# Patient Record
Sex: Male | Born: 1955 | Race: White | Hispanic: No | Marital: Married | Smoking: Current every day smoker
Health system: Southern US, Community
[De-identification: ages and names within clinical notes are randomized; demographics above are authoritative.]

## PROBLEM LIST (undated history)

## (undated) DIAGNOSIS — Z91018 Allergy to other foods: Secondary | ICD-10-CM

## (undated) DIAGNOSIS — R350 Frequency of micturition: Secondary | ICD-10-CM

## (undated) DIAGNOSIS — T782XXA Anaphylactic shock, unspecified, initial encounter: Secondary | ICD-10-CM

## (undated) DIAGNOSIS — Z86010 Personal history of colonic polyps: Principal | ICD-10-CM

## (undated) DIAGNOSIS — Z7689 Persons encountering health services in other specified circumstances: Secondary | ICD-10-CM

## (undated) DIAGNOSIS — K922 Gastrointestinal hemorrhage, unspecified: Secondary | ICD-10-CM

## (undated) DIAGNOSIS — A048 Other specified bacterial intestinal infections: Secondary | ICD-10-CM

## (undated) HISTORY — DX: Allergy to other foods: Z91.018

## (undated) HISTORY — PX: PROSTATE SURGERY: SHX751

## (undated) HISTORY — PX: ANKLE FRACTURE SURGERY: SHX122

## (undated) HISTORY — PX: RHINOPLASTY: SUR1284

## (undated) HISTORY — DX: Other specified bacterial intestinal infections: A04.8

## (undated) HISTORY — DX: Frequency of micturition: R35.0

## (undated) HISTORY — DX: Gastrointestinal hemorrhage, unspecified: K92.2

## (undated) HISTORY — DX: Personal history of colonic polyps: Z86.010

## (undated) HISTORY — DX: Persons encountering health services in other specified circumstances: Z76.89

---

## 2003-11-30 ENCOUNTER — Emergency Department: Payer: Self-pay | Admitting: Emergency Medicine

## 2005-08-16 ENCOUNTER — Inpatient Hospital Stay: Payer: Self-pay | Admitting: Specialist

## 2008-07-30 ENCOUNTER — Emergency Department: Payer: Self-pay | Admitting: Emergency Medicine

## 2010-05-26 ENCOUNTER — Emergency Department (HOSPITAL_COMMUNITY)
Admission: EM | Admit: 2010-05-26 | Discharge: 2010-05-27 | Disposition: A | Discharge status: Home / Self Care | Payer: Self-pay | Attending: Emergency Medicine | Admitting: Emergency Medicine

## 2010-05-26 DIAGNOSIS — X58XXXA Exposure to other specified factors, initial encounter: Secondary | ICD-10-CM | POA: Insufficient documentation

## 2010-05-26 DIAGNOSIS — T782XXA Anaphylactic shock, unspecified, initial encounter: Secondary | ICD-10-CM | POA: Insufficient documentation

## 2010-05-26 LAB — DIFFERENTIAL
Basophils Absolute: 0 10*3/uL (ref 0.0–0.1)
Lymphocytes Relative: 9 % — ABNORMAL LOW (ref 12–46)
Lymphs Abs: 1.4 10*3/uL (ref 0.7–4.0)
Neutrophils Relative %: 85 % — ABNORMAL HIGH (ref 43–77)

## 2010-05-26 LAB — CBC
HCT: 45.9 % (ref 39.0–52.0)
MCV: 97.2 fL (ref 78.0–100.0)
Platelets: 330 10*3/uL (ref 150–400)
RBC: 4.72 MIL/uL (ref 4.22–5.81)
WBC: 15.4 10*3/uL — ABNORMAL HIGH (ref 4.0–10.5)

## 2010-05-26 LAB — POCT I-STAT, CHEM 8
Chloride: 110 mEq/L (ref 96–112)
HCT: 48 % (ref 39.0–52.0)
Potassium: 3.6 mEq/L (ref 3.5–5.1)
Sodium: 142 mEq/L (ref 135–145)

## 2010-07-02 ENCOUNTER — Inpatient Hospital Stay (INDEPENDENT_AMBULATORY_CARE_PROVIDER_SITE_OTHER)
Admission: RE | Admit: 2010-07-02 | Discharge: 2010-07-02 | Disposition: A | Discharge status: Home / Self Care | Payer: Self-pay | Source: Ambulatory Visit | Attending: Emergency Medicine | Admitting: Emergency Medicine

## 2010-07-02 DIAGNOSIS — L509 Urticaria, unspecified: Secondary | ICD-10-CM

## 2012-09-07 ENCOUNTER — Encounter (HOSPITAL_COMMUNITY): Payer: Self-pay

## 2012-09-07 ENCOUNTER — Emergency Department (HOSPITAL_COMMUNITY)
Admission: EM | Admit: 2012-09-07 | Discharge: 2012-09-07 | Disposition: A | Discharge status: Home / Self Care | Payer: Self-pay | Attending: Emergency Medicine | Admitting: Emergency Medicine

## 2012-09-07 DIAGNOSIS — R21 Rash and other nonspecific skin eruption: Secondary | ICD-10-CM | POA: Insufficient documentation

## 2012-09-07 DIAGNOSIS — L299 Pruritus, unspecified: Secondary | ICD-10-CM | POA: Insufficient documentation

## 2012-09-07 DIAGNOSIS — R220 Localized swelling, mass and lump, head: Secondary | ICD-10-CM | POA: Insufficient documentation

## 2012-09-07 DIAGNOSIS — R4789 Other speech disturbances: Secondary | ICD-10-CM | POA: Insufficient documentation

## 2012-09-07 DIAGNOSIS — T7840XA Allergy, unspecified, initial encounter: Secondary | ICD-10-CM

## 2012-09-07 MED ORDER — DIPHENHYDRAMINE HCL 25 MG PO CAPS: 25.0000 mg | ORAL_CAPSULE | Freq: Four times a day (QID) | ORAL | Status: DC | PRN

## 2012-09-07 MED ORDER — PREDNISONE 50 MG PO TABS: 50.0000 mg | ORAL_TABLET | Freq: Every day | ORAL | Status: DC

## 2012-09-07 MED ORDER — EPINEPHRINE 0.3 MG/0.3ML IJ SOAJ
0.3000 mg | Freq: Once | INTRAMUSCULAR | Status: AC
  Administered 2012-09-07: 0.3 mg via INTRAMUSCULAR

## 2012-09-07 MED ORDER — METHYLPREDNISOLONE SODIUM SUCC 125 MG IJ SOLR
125.0000 mg | Freq: Once | INTRAMUSCULAR | Status: AC
  Administered 2012-09-07: 125 mg via INTRAVENOUS

## 2012-09-07 MED ORDER — FAMOTIDINE IN NACL 20-0.9 MG/50ML-% IV SOLN
20.0000 mg | Freq: Once | INTRAVENOUS | Status: AC
  Administered 2012-09-07: 20 mg via INTRAVENOUS

## 2012-09-07 MED ORDER — FAMOTIDINE 20 MG PO TABS
20.0000 mg | ORAL_TABLET | Freq: Once | ORAL | Status: AC
  Administered 2012-09-07: 20 mg via ORAL
  Filled 2012-09-07: qty 1

## 2012-09-07 MED ORDER — EPINEPHRINE 0.3 MG/0.3ML IJ SOAJ
INTRAMUSCULAR | Status: AC
  Administered 2012-09-07: 0.3 mg via INTRAMUSCULAR
  Filled 2012-09-07: qty 0.3

## 2012-09-07 MED ORDER — DIPHENHYDRAMINE HCL 50 MG/ML IJ SOLN
25.0000 mg | Freq: Once | INTRAMUSCULAR | Status: AC
  Administered 2012-09-07: 25 mg via INTRAVENOUS

## 2012-09-07 MED ORDER — PREDNISONE 20 MG PO TABS
60.0000 mg | ORAL_TABLET | Freq: Once | ORAL | Status: AC
  Administered 2012-09-07: 60 mg via ORAL
  Filled 2012-09-07: qty 3

## 2012-09-07 MED ORDER — DIPHENHYDRAMINE HCL 25 MG PO CAPS
25.0000 mg | ORAL_CAPSULE | Freq: Once | ORAL | Status: AC
  Administered 2012-09-07: 25 mg via ORAL
  Filled 2012-09-07: qty 1

## 2012-09-07 MED ORDER — EPINEPHRINE 0.3 MG/0.3ML IJ SOAJ: 0.3000 mg | INTRAMUSCULAR | Status: DC | PRN

## 2012-09-07 NOTE — ED Provider Notes (Addendum)
  CSN: [id number]     Arrival date & time 09/07/12  2130 History     First MD Initiated Contact with Patient 09/07/12 [phone]     No chief complaint on file.  (Consider location/radiation/quality/duration/timing/severity/associated sxs/prior Treatment) HPI Comments: Pt with hx of anaphylaxic type allergic reaction hx comes in with cc of itching, rash. Pt was out in the yard doing some work, when around 6:40 am he started having rash and itching. Overtime, his rash spread, and he started itching more, felt like his tongue started swelling - so they come to the ER. Pt has hx of anaphylaxis in the past.  The history is provided by the patient and a friend.    No past medical history on file. No past surgical history on file. No family history on file. History  Substance Use Topics  . Smoking status: Not on file  . Smokeless tobacco: Not on file  . Alcohol Use: Not on file    Review of Systems  Constitutional: Negative for fever.  HENT: Positive for facial swelling.   Respiratory: Negative for cough, shortness of breath, wheezing and stridor.   Cardiovascular: Negative for chest pain.  Gastrointestinal: Negative for abdominal pain.  Genitourinary: Negative for dysuria.  Skin: Positive for rash.  Allergic/Immunologic: Positive for environmental allergies. Negative for immunocompromised state.  Neurological: Positive for speech difficulty.  Psychiatric/Behavioral: Negative for confusion.    Allergies  Review of patient's allergies indicates not on file.  Home Medications  No current outpatient prescriptions on file. BP 118/58  Pulse 117  Temp(Src) 97.9 F (36.6 C) (Oral)  Resp 20  SpO2 94% Physical Exam  Nursing note and vitals reviewed. Constitutional: He is oriented to person, place, and time. He appears well-developed.  HENT:  Head: Normocephalic and atraumatic.  Pt has mild oral swelling and slurred speech.  Eyes: Conjunctivae and EOM are normal. Pupils are equal,  round, and reactive to light.  Neck: Normal range of motion. Neck supple.  Cardiovascular: Normal rate and regular rhythm.   Pulmonary/Chest: Effort normal and breath sounds normal. No stridor. He has no wheezes. He has no rales.  Abdominal: Soft.  Neurological: He is alert and oriented to person, place, and time.  Skin: Skin is warm. Rash noted.  Diffuse blanching erythematous raised lesions.    ED Course   Procedures (including critical care time)  Labs Reviewed - No data to display No results found. No diagnosis found.  MDM  Pt comes in with allergic reaction. Anaphylaxis type reaction. Erythematous lesion diffusely. BP is WNL. Epi .3 IM, benadryl, pepcid, solumedrol given. Airway protected.   Derwood Kaplan, MD 09/07/12 0751  12:39 PM Repeat exam improved. Pt feeling well. Will d.c  Derwood Kaplan, MD 09/07/12 1239

## 2012-09-07 NOTE — ED Notes (Signed)
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

## 2013-08-02 ENCOUNTER — Emergency Department (HOSPITAL_COMMUNITY)
Admission: EM | Admit: 2013-08-02 | Discharge: 2013-08-02 | Disposition: A | Discharge status: Home / Self Care | Payer: BC Managed Care – PPO | Attending: Emergency Medicine | Admitting: Emergency Medicine

## 2013-08-02 ENCOUNTER — Encounter (HOSPITAL_COMMUNITY): Payer: Self-pay | Admitting: Emergency Medicine

## 2013-08-02 DIAGNOSIS — L500 Allergic urticaria: Secondary | ICD-10-CM | POA: Insufficient documentation

## 2013-08-02 DIAGNOSIS — Z87892 Personal history of anaphylaxis: Secondary | ICD-10-CM | POA: Insufficient documentation

## 2013-08-02 DIAGNOSIS — N4889 Other specified disorders of penis: Secondary | ICD-10-CM | POA: Insufficient documentation

## 2013-08-02 DIAGNOSIS — L509 Urticaria, unspecified: Secondary | ICD-10-CM

## 2013-08-02 HISTORY — DX: Anaphylactic shock, unspecified, initial encounter: T78.2XXA

## 2013-08-02 MED ORDER — FAMOTIDINE 20 MG PO TABS
20.0000 mg | ORAL_TABLET | Freq: Once | ORAL | Status: AC
  Administered 2013-08-02: 20 mg via ORAL
  Filled 2013-08-02: qty 1

## 2013-08-02 MED ORDER — FAMOTIDINE 20 MG PO TABS: 20.0000 mg | ORAL_TABLET | Freq: Two times a day (BID) | ORAL | Status: DC

## 2013-08-02 MED ORDER — DIPHENHYDRAMINE HCL 50 MG PO CAPS
50.0000 mg | ORAL_CAPSULE | Freq: Four times a day (QID) | ORAL | Status: DC
Start: 2013-08-02 — End: 2016-04-17

## 2013-08-02 MED ORDER — PREDNISONE 20 MG PO TABS: 40.0000 mg | ORAL_TABLET | Freq: Once | ORAL | Status: DC

## 2013-08-02 MED ORDER — PREDNISONE 20 MG PO TABS
60.0000 mg | ORAL_TABLET | Freq: Once | ORAL | Status: AC
  Administered 2013-08-02: 60 mg via ORAL
  Filled 2013-08-02: qty 3

## 2013-08-02 MED ORDER — DIPHENHYDRAMINE HCL 25 MG PO CAPS
50.0000 mg | ORAL_CAPSULE | Freq: Once | ORAL | Status: AC
  Administered 2013-08-02: 50 mg via ORAL
  Filled 2013-08-02: qty 2

## 2013-08-02 NOTE — Discharge Instructions (Signed)
Hives Hives are itchy, red, swollen areas of the skin. They can vary in size and location on your body. Hives can come and go for hours or several days (acute hives) or for several weeks (chronic hives). Hives do not spread from person to person (noncontagious). They may get worse with scratching, exercise, and emotional stress. CAUSES   Allergic reaction to food, additives, or drugs.  Infections, including the common cold.  Illness, such as vasculitis, lupus, or thyroid disease.  Exposure to sunlight, heat, or cold.  Exercise.  Stress.  Contact with chemicals. SYMPTOMS   Red or white swollen patches on the skin. The patches may change size, shape, and location quickly and repeatedly.  Itching.  Swelling of the hands, feet, and face. This may occur if hives develop deeper in the skin. DIAGNOSIS  Your caregiver can usually tell what is wrong by performing a physical exam. Skin or blood tests may also be done to determine the cause of your hives. In some cases, the cause cannot be determined. TREATMENT  Mild cases usually get better with medicines such as antihistamines. Severe cases may require an emergency epinephrine injection. If the cause of your hives is known, treatment includes avoiding that trigger.  HOME CARE INSTRUCTIONS   Avoid causes that trigger your hives.  Take antihistamines as directed by your caregiver to reduce the severity of your hives. Non-sedating or low-sedating antihistamines are usually recommended. Do not drive while taking an antihistamine.  Take any other medicines prescribed for itching as directed by your caregiver.  Wear loose-fitting clothing.  Keep all follow-up appointments as directed by your caregiver. SEEK MEDICAL CARE IF:   You have persistent or severe itching that is not relieved with medicine.  You have painful or swollen joints. SEEK IMMEDIATE MEDICAL CARE IF:   You have a fever.  Your tongue or lips are swollen.  You have  trouble breathing or swallowing.  You feel tightness in the throat or chest.  You have abdominal pain. These problems may be the first sign of a life-threatening allergic reaction. Call your local emergency services (911 in [place] MAKE SURE YOU:   Understand these instructions.  Will watch your condition.  Will get help right away if you are not doing well or get worse. Document Released: 01/09/2005 Document Revised: 01/14/2013 Document Reviewed: 04/04/2011 Exit[employer] Patient Information 2015 [employer]. This information is not intended to replace advice given to you by your health care provider. Make sure you discuss any questions you have with your health care provider.  

## 2013-08-02 NOTE — ED Provider Notes (Signed)
CSN: [id number]     Arrival date & time 08/02/13  0732 History   First MD Initiated Contact with Patient 08/02/13 0739     Chief Complaint  Patient presents with  . Rash     (Consider location/radiation/quality/duration/timing/severity/associated sxs/prior Treatment) Patient is a 58 y.o. male presenting with rash. The history is provided by the patient.  Rash Location:  Ano-genital Ano-genital rash location:  Penis Quality: swelling   Severity:  Mild Onset quality:  Gradual Duration:  3 hours Timing:  Constant Progression:  Improving Chronicity:  Recurrent Context: animal contact (held an animal with tick medication he's allergic to)   Relieved by: OTC med - unknown. Associated symptoms: no fever and no shortness of breath     Past Medical History  Diagnosis Date  . Anaphylactic reaction    Past Surgical History  Procedure Laterality Date  . Ankle fracture surgery    . Prostate surgery    . Rhinoplasty     No family history on file. History  Substance Use Topics  . Smoking status: Never Smoker   . Smokeless tobacco: Not on file  . Alcohol Use: No    Review of Systems  Constitutional: Negative for fever.  Respiratory: Negative for cough and shortness of breath.   Skin: Positive for rash.  All other systems reviewed and are negative.     Allergies  Review of patient's allergies indicates no known allergies.  Home Medications   Prior to Admission medications   Medication Sig Start Date End Date Taking? Authorizing Provider  DiphenhydrAMINE HCl (ALLERGY MED PO) Take 1 tablet by mouth daily as needed (allergies).   Yes Historical Provider, MD  EPINEPHrine 0.3 mg/0.3 mL IJ SOAJ injection Inject 0.3 mg into the muscle daily as needed (allerci reaction).   Yes Historical Provider, MD   BP 146/83  Pulse 74  Temp(Src) 98.4 F (36.9 C) (Oral)  Resp 22  Ht 5\' 10"  (1.778 m)  Wt 183 lb (83.008 kg)  BMI 26.26 kg/m2  SpO2 100% Physical Exam  Nursing note and  vitals reviewed. Constitutional: He is oriented to person, place, and time. He appears well-developed and well-nourished. No distress.  HENT:  Head: Normocephalic and atraumatic.  Mouth/Throat: Oropharynx is clear and moist. No oropharyngeal exudate.  Eyes: EOM are normal. Pupils are equal, round, and reactive to light.  Neck: Normal range of motion. Neck supple.  Cardiovascular: Normal rate and regular rhythm.  Exam reveals no friction rub.   No murmur heard. Pulmonary/Chest: Effort normal and breath sounds normal. No respiratory distress. He has no wheezes. He has no rales.  Abdominal: Soft. He exhibits no distension. There is no tenderness. There is no rebound.  Genitourinary:  Mild penile shaft edema. No phimosis or paraphimosis  Musculoskeletal: Normal range of motion. He exhibits no edema.  Neurological: He is alert and oriented to person, place, and time.  Skin: Rash (mild hives to bilateral inner thighs) noted. He is not diaphoretic.    ED Course  Procedures (including critical care time) Labs Review Labs Reviewed - No data to display  Imaging Review No results found.   EKG Interpretation None      MDM   Final diagnoses:  Penile swelling  Hives    16M presents with allergic reaction. Has had EpiPen previously but did not give it to himself last night. AFVSS here. Mild penile swelling, no phimosis or paraphimosis. Mild hives to bilateral inner thighs. Airway patent, no stridor, no wheezing, no difficulty breathing. Will  give steroids, benadryl, famotidine. After meds and some time, swelling improving. Patient give steroids, benadryl, stable for discharge.   Osvaldo Shipper, MD 08/02/13 [phone]

## 2013-08-02 NOTE — ED Notes (Signed)
Pt reports history of anaphylaxis shock in the past. Pt reports that he was holding a [place] today and has whelps over the lower half of his body. Pt took a pill for allergic reaction at home PTA. Symptoms onset at 0500. Pt reports symptoms resolving at present.

## 2013-08-17 ENCOUNTER — Emergency Department (HOSPITAL_COMMUNITY)
Admission: EM | Admit: 2013-08-17 | Discharge: 2013-08-17 | Disposition: A | Discharge status: Home / Self Care | Payer: BC Managed Care – PPO | Attending: Emergency Medicine | Admitting: Emergency Medicine

## 2013-08-17 ENCOUNTER — Encounter (HOSPITAL_COMMUNITY): Payer: Self-pay | Admitting: Emergency Medicine

## 2013-08-17 DIAGNOSIS — R21 Rash and other nonspecific skin eruption: Secondary | ICD-10-CM | POA: Insufficient documentation

## 2013-08-17 DIAGNOSIS — L509 Urticaria, unspecified: Secondary | ICD-10-CM | POA: Insufficient documentation

## 2013-08-17 MED ORDER — DIPHENHYDRAMINE HCL 25 MG PO CAPS: 50.0000 mg | ORAL_CAPSULE | Freq: Four times a day (QID) | ORAL | Status: AC | PRN

## 2013-08-17 MED ORDER — METHYLPREDNISOLONE SODIUM SUCC 125 MG IJ SOLR
125.0000 mg | Freq: Once | INTRAMUSCULAR | Status: AC
  Administered 2013-08-17: 125 mg via INTRAVENOUS
  Filled 2013-08-17: qty 2

## 2013-08-17 MED ORDER — FAMOTIDINE 20 MG PO TABS: 20.0000 mg | ORAL_TABLET | Freq: Every day | ORAL | Status: AC

## 2013-08-17 MED ORDER — FAMOTIDINE IN NACL 20-0.9 MG/50ML-% IV SOLN
20.0000 mg | Freq: Once | INTRAVENOUS | Status: AC
  Administered 2013-08-17: 20 mg via INTRAVENOUS
  Filled 2013-08-17: qty 50

## 2013-08-17 MED ORDER — DIPHENHYDRAMINE HCL 50 MG/ML IJ SOLN
25.0000 mg | Freq: Once | INTRAMUSCULAR | Status: AC
  Administered 2013-08-17: 25 mg via INTRAVENOUS
  Filled 2013-08-17: qty 1

## 2013-08-17 MED ORDER — PREDNISONE 50 MG PO TABS: 50.0000 mg | ORAL_TABLET | Freq: Every day | ORAL | Status: AC

## 2013-08-17 NOTE — ED Notes (Signed)
Pt has a history of anaphylaxis and severe allergic reactions. Today he began to have hives and nausea , he is unsure what he may have been exposed to, but he is concerned for allergic reaction. He took 3 of his prescriptions and 2 OTC allergy meds but symptoms persist. He is anxious and keeps scratching his body.

## 2013-08-17 NOTE — ED Provider Notes (Signed)
CSN: [id number]     Arrival date & time 7/Joseph/15  1701 History   First MD Initiated Contact with Patient 07/Joseph/15 1729     Chief Complaint  Patient presents with  . Allergic Reaction     (Consider location/radiation/quality/duration/timing/severity/associated sxs/prior Treatment) Patient is a 58 y.o. [place] presenting with allergic reaction. The history is provided by the patient and the spouse.  Allergic Reaction Presenting symptoms: itching and rash   Presenting symptoms: no difficulty breathing, no difficulty swallowing, no swelling and no wheezing   Itching:    Location:  Leg Rash:    Location:  Chest, leg and arm Severity:  Mild Prior allergic episodes:  Allergies to medications Context: food (Sister in law brought food. Unsure of ingredients. No previous food allergies.)   Relieved by:  Antihistamines Worsened by:  Nothing tried   4pm Past Medical History  Diagnosis Date  . Anaphylactic reaction    Past Surgical History  Procedure Laterality Date  . Ankle fracture surgery    . Prostate surgery    . Rhinoplasty     History reviewed. No pertinent family history. History  Substance Use Topics  . Smoking status: Never Smoker   . Smokeless tobacco: Not on file  . Alcohol Use: No    Review of Systems  HENT: Negative for trouble swallowing.   Respiratory: Negative for wheezing.   Skin: Positive for itching and rash.  All other systems reviewed and are negative.     Allergies  Review of patient's allergies indicates no known allergies.  Home Medications   Prior to Admission medications   Medication Sig Start Date End Date Taking? Authorizing Provider  diphenhydrAMINE (BENADRYL) 50 MG capsule Take 1 capsule (50 mg total) by mouth every 6 (six) hours. Please take every 6 hours for the next 2 days then every 6 hours as needed after that 08/02/13  Yes Osvaldo Shipper, MD  EPINEPHrine 0.3 mg/0.3 mL IJ SOAJ injection Inject 0.3 mg into the muscle daily as needed  (allerci reaction).   Yes Historical Provider, MD  diphenhydrAMINE (BENADRYL) 25 mg capsule Take 2 capsules (50 mg total) by mouth every 6 (six) hours as needed for itching. 08/18/13 08/21/13  Cordelia Poche, MD  famotidine (PEPCID) 20 MG tablet Take 1 tablet (20 mg total) by mouth daily. 08/18/13 08/21/13  Cordelia Poche, MD  predniSONE (DELTASONE) 50 MG tablet Take 1 tablet (50 mg total) by mouth daily with breakfast. 08/18/13 08/21/13  Cordelia Poche, MD   BP 112/63  Pulse 83  Temp(Src) 98 F (36.7 C) (Oral)  Resp 14  Ht 5\' 10"  (1.778 m)  Wt 185 lb (83.915 kg)  BMI Joseph.54 kg/m2  SpO2 99% Physical Exam  Constitutional: He is oriented to person, place, and time.  HENT:  Mouth/Throat: Uvula is midline, oropharynx is clear and moist and mucous membranes are normal. No posterior oropharyngeal edema.  Eyes: Conjunctivae and EOM are normal.  Cardiovascular: Normal rate, regular rhythm and normal pulses.   Pulmonary/Chest: Effort normal and breath sounds normal. No respiratory distress. He has no decreased breath sounds. He has no wheezes.  Abdominal: Soft. Normal appearance and bowel sounds are normal. There is no tenderness.  Neurological: He is alert and oriented to person, place, and time.  Skin: Skin is warm and dry. Rash noted. Rash is urticarial (On chest, bilateral arms and bilateral thighs).    ED Course  Procedures (including critical care time) Labs Review Labs Reviewed - No data to display  Imaging Review  No results found.   EKG Interpretation None      MDM   Final diagnoses:  Urticaria   Patient without diffuse swelling. Oropharynx clear and non-edematous. Tongue is not swollen. Vital signs stable with no hypotension. Fluids, benadryl, solumedrol and pepcid given via IV. Patient tolerated and symptoms improved. Patient to be discharge with continued steroid burst. Recommended follow-up with PCP since patient has had multiple recent allergic reactions. Patient understood and  agreed with plan. Stable for discharge home.  Cordelia Poche, MD 08/18/13 [phone]

## 2013-08-17 NOTE — Discharge Instructions (Signed)
Anaphylactic Reaction An anaphylactic reaction is a sudden, severe allergic reaction that involves the whole body. It can be life threatening. A hospital stay is often required. People with asthma, eczema, or hay fever are slightly more likely to have an anaphylactic reaction. CAUSES  An anaphylactic reaction may be caused by anything to which you are allergic. After being exposed to the allergic substance, your immune system becomes sensitized to it. When you are exposed to that allergic substance again, an allergic reaction can occur. Common causes of an anaphylactic reaction include:  Medicines.  Foods, especially peanuts, wheat, shellfish, milk, and eggs.  Insect bites or stings.  Blood products.  Chemicals, such as dyes, latex, and contrast material used for imaging tests. SYMPTOMS  When an allergic reaction occurs, the body releases histamine and other substances. These substances cause symptoms such as tightening of the airway. Symptoms often develop within seconds or minutes of exposure. Symptoms may include:  Skin rash or hives.  Itching.  Chest tightness.  Swelling of the eyes, tongue, or lips.  Trouble breathing or swallowing.  Lightheadedness or fainting.  Anxiety or confusion.  Stomach pains, vomiting, or diarrhea.  Nasal congestion.  A fast or irregular heartbeat (palpitations). DIAGNOSIS  Diagnosis is based on your history of recent exposure to allergic substances, your symptoms, and a physical exam. Your caregiver may also perform blood or urine tests to confirm the diagnosis. TREATMENT  Epinephrine medicine is the main treatment for an anaphylactic reaction. Other medicines that may be used for treatment include antihistamines, steroids, and albuterol. In severe cases, fluids and medicine to support blood pressure may be given through an intravenous line (IV). Even if you improve after treatment, you need to be observed to make sure your condition does not get  worse. This may require a stay in the hospital. HOME CARE INSTRUCTIONS   Wear a medical alert bracelet or necklace stating your allergy.  You and your family must learn how to use an anaphylaxis kit or give an epinephrine injection to temporarily treat an emergency allergic reaction. Always carry your epinephrine injection or anaphylaxis kit with you. This can be lifesaving if you have a severe reaction.  Do not drive or perform tasks after treatment until the medicines used to treat your reaction have worn off, or until your caregiver says it is okay.  If you have hives or a rash:  Take medicines as directed by your caregiver.  You may use an over-the-counter antihistamine (diphenhydramine) as needed.  Apply cold compresses to the skin or take baths in cool water. Avoid hot baths or showers. SEEK MEDICAL CARE IF:   You develop symptoms of an allergic reaction to a new substance. Symptoms may start right away or minutes later.  You develop a rash, hives, or itching.  You develop new symptoms. SEEK IMMEDIATE MEDICAL CARE IF:   You have swelling of the mouth, difficulty breathing, or wheezing.  You have a tight feeling in your chest or throat.  You develop hives, swelling, or itching all over your body.  You develop severe vomiting or diarrhea.  You feel faint or pass out. This is an emergency. Use your epinephrine injection or anaphylaxis kit as you have been instructed. Call your local emergency services (911 in [place] Even if you improve after the injection, you need to be examined at a hospital emergency department. MAKE SURE YOU:   Understand these instructions.  Will watch your condition.  Will get help right away if you are not   doing well or get worse. Document Released: 01/09/2005 Document Revised: 01/14/2013 Document Reviewed: 04/12/2011 [employer] Patient Information 2015 [employer]. This information is not intended to replace advice given to you by your health  care provider. Make sure you discuss any questions you have with your health care provider.  

## 2013-09-01 NOTE — ED Provider Notes (Signed)
I saw and evaluated the patient, reviewed the resident's note and I agree with the findings and plan.   .Face to face Exam:  General:  Awake HEENT:  Atraumatic Resp:  Normal effort Abd:  Nondistended Neuro:No focal weakness   Dot Lanes, MD 09/01/13 1457

## 2013-11-09 ENCOUNTER — Encounter (HOSPITAL_COMMUNITY): Payer: Self-pay | Admitting: Emergency Medicine

## 2013-11-09 ENCOUNTER — Emergency Department (HOSPITAL_COMMUNITY)
Admission: EM | Admit: 2013-11-09 | Discharge: 2013-11-09 | Disposition: A | Discharge status: Home / Self Care | Payer: BC Managed Care – PPO | Source: Home / Self Care | Attending: Family Medicine | Admitting: Family Medicine

## 2013-11-09 DIAGNOSIS — L500 Allergic urticaria: Secondary | ICD-10-CM

## 2013-11-09 MED ORDER — METHYLPREDNISOLONE SODIUM SUCC 125 MG IJ SOLR
INTRAMUSCULAR | Status: AC
  Filled 2013-11-09: qty 2

## 2013-11-09 MED ORDER — EPINEPHRINE 0.3 MG/0.3ML IJ SOAJ: 0.3000 mg | Freq: Once | INTRAMUSCULAR | Status: AC

## 2013-11-09 MED ORDER — FAMOTIDINE 20 MG PO TABS
ORAL_TABLET | ORAL | Status: AC
  Filled 2013-11-09: qty 2

## 2013-11-09 MED ORDER — FAMOTIDINE 20 MG PO TABS
40.0000 mg | ORAL_TABLET | Freq: Once | ORAL | Status: AC
  Administered 2013-11-09: 40 mg via ORAL

## 2013-11-09 MED ORDER — METHYLPREDNISOLONE SODIUM SUCC 125 MG IJ SOLR
125.0000 mg | Freq: Once | INTRAMUSCULAR | Status: AC
  Administered 2013-11-09: 125 mg via INTRAMUSCULAR

## 2013-11-09 MED ORDER — DIPHENHYDRAMINE HCL 50 MG/ML IJ SOLN
50.0000 mg | Freq: Once | INTRAMUSCULAR | Status: AC
  Administered 2013-11-09: 50 mg via INTRAMUSCULAR

## 2013-11-09 MED ORDER — DIPHENHYDRAMINE HCL 50 MG/ML IJ SOLN
INTRAMUSCULAR | Status: AC
Start: 2013-11-09 — End: 2013-11-09
  Filled 2013-11-09: qty 1

## 2013-11-09 MED ORDER — DIPHENHYDRAMINE HCL 50 MG/ML IJ SOLN: 50.0000 mg | Freq: Once | INTRAMUSCULAR | Status: DC

## 2013-11-09 NOTE — ED Provider Notes (Signed)
CSN: [id number]     Arrival date & time 11/09/13  1301 History   None    Chief Complaint  Patient presents with  . Allergic Reaction   (Consider location/radiation/quality/duration/timing/severity/associated sxs/prior Treatment) HPI   58 year old male presents complaining of allergic reaction. 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 He says that he thought maybe this time it would not happen this time so he ate some ham and subsequently developed an itchy red rash. He took 50 mg of Benadryl one hour ago. He is currently not experiencing any shortness of breath, swelling of lips or tongue, abdominal pain, nausea, vomiting, dizziness, lightheadedness. he just has an extremely itchy red rash. He has an EpiPen but he is not felt like he needs to use it yet.  Past Medical History  Diagnosis Date  . Anaphylactic reaction    Past Surgical History  Procedure Laterality Date  . Ankle fracture surgery    . Prostate surgery    . Rhinoplasty     No family history on file. History  Substance Use Topics  . Smoking status: Never Smoker   . Smokeless tobacco: Not on file  . Alcohol Use: No    Review of Systems  Skin: Positive for rash (see history of present illness).  All other systems reviewed and are negative.   Allergies  Ham and Other  Home Medications   Prior to Admission medications   Medication Sig Start Date End Date Taking? Authorizing Provider  diphenhydrAMINE (BENADRYL) 50 MG capsule Take 1 capsule (50 mg total) by mouth every 6 (six) hours. Please take every 6 hours for the next 2 days then every 6 hours as needed after that 08/02/13  Yes Evelina Bucy, MD  EPINEPHrine (EPIPEN 2-PAK) 0.3 mg/0.3 mL IJ SOAJ injection Inject 0.3 mLs (0.3 mg total) into the muscle once. Repeat in 5 minutes if necessary 11/09/13   Liam Graham, PA-C  EPINEPHrine 0.3 mg/0.3 mL IJ SOAJ injection Inject  0.3 mg into the muscle daily as needed (allerci reaction).    Historical Provider, MD   BP 134/82  Pulse 90  Temp(Src) 97.9 F (36.6 C) (Oral)  Resp 18  SpO2 98% Physical Exam  Nursing note and vitals reviewed. Constitutional: He is oriented to person, place, and time. He appears well-developed and well-nourished. No distress.  HENT:  Head: Normocephalic.  Mouth/Throat: Uvula is midline, oropharynx is clear and moist and mucous membranes are normal. No posterior oropharyngeal edema.  Cardiovascular: Normal rate, regular rhythm and normal heart sounds.   Pulmonary/Chest: Effort normal and breath sounds normal. No respiratory distress. He has no wheezes. He has no rales.  Abdominal: Soft. Bowel sounds are normal. He exhibits no distension and no mass. There is no tenderness. There is no rebound and no guarding.  Neurological: He is alert and oriented to person, place, and time. Coordination normal.  Skin: Skin is warm and dry. No rash noted. He is not diaphoretic.  Psychiatric: He has a normal mood and affect. Judgment normal.    ED Course  Procedures (including critical care time) Labs Review Labs Reviewed - No data to display  Imaging Review No results found.   MDM   1. Allergic urticaria due to ingested food    Complete resolution of rash and all symptoms within one hour after receiving 50 mg of IM Benadryl, 40 mg of by mouth Pepcid, and 125 mg IM Solu-Medrol.  I will give him a new prescription for an EpiPen because his expired. He has Benadryl to take if symptoms recur. ER return precautions discussed with the patient.   Meds ordered this encounter  Medications  . DISCONTD: diphenhydrAMINE (BENADRYL) injection 50 mg    Sig:   . famotidine (PEPCID) tablet 40 mg    Sig:   . methylPREDNISolone sodium succinate (SOLU-MEDROL) 125 mg/2 mL injection 125 mg    Sig:   . diphenhydrAMINE (BENADRYL) injection 50 mg    Sig:   . EPINEPHrine (EPIPEN 2-PAK) 0.3 mg/0.3 mL IJ SOAJ  injection    Sig: Inject 0.3 mLs (0.3 mg total) into the muscle once. Repeat in 5 minutes if necessary    Dispense:  2 Device    Refill:  0    Order Specific Question:  Supervising Provider    Answer:  Lynne Leader, [place]       Liam Graham, PA-C 11/09/13 1451

## 2013-11-09 NOTE — ED Notes (Signed)
Pt feeling much better.  Urticaria nearly gone.

## 2013-11-09 NOTE — ED Notes (Signed)
Reports eating ham approx 3 hrs ago - forgot he's allergic to ham (anaphylactic reaction in past).  Started with urticaria - took 50mg  diphenhydramine @ 1230.  Did not use epi pen.  States his throat, mouth, breathing all feel completely normal.  Urticaria noted.

## 2013-11-09 NOTE — ED Notes (Signed)
Continues to deny any breathing issues, swelling in throat or mouth.

## 2013-11-09 NOTE — Discharge Instructions (Signed)
Hives °Hives are itchy, red, swollen areas of the skin. They can vary in size and location on your body. Hives can come and go for hours or several days (acute hives) or for several weeks (chronic hives). Hives do not spread from person to person (noncontagious). They may get worse with scratching, exercise, and emotional stress. °CAUSES  °· Allergic reaction to food, additives, or drugs. °· Infections, including the common cold. °· Illness, such as vasculitis, lupus, or thyroid disease. °· Exposure to sunlight, heat, or cold. °· Exercise. °· Stress. °· Contact with chemicals. °SYMPTOMS  °· Red or white swollen patches on the skin. The patches may change size, shape, and location quickly and repeatedly. °· Itching. °· Swelling of the hands, feet, and face. This may occur if hives develop deeper in the skin. °DIAGNOSIS  °Your caregiver can usually tell what is wrong by performing a physical exam. Skin or blood tests may also be done to determine the cause of your hives. In some cases, the cause cannot be determined. °TREATMENT  °Mild cases usually get better with medicines such as antihistamines. Severe cases may require an emergency epinephrine injection. If the cause of your hives is known, treatment includes avoiding that trigger.  °HOME CARE INSTRUCTIONS  °· Avoid causes that trigger your hives. °· Take antihistamines as directed by your caregiver to reduce the severity of your hives. Non-sedating or low-sedating antihistamines are usually recommended. Do not drive while taking an antihistamine. °· Take any other medicines prescribed for itching as directed by your caregiver. °· Wear loose-fitting clothing. °· Keep all follow-up appointments as directed by your caregiver. °SEEK MEDICAL CARE IF:  °· You have persistent or severe itching that is not relieved with medicine. °· You have painful or swollen joints. °SEEK IMMEDIATE MEDICAL CARE IF:  °· You have a fever. °· Your tongue or lips are swollen. °· You have  trouble breathing or swallowing. °· You feel tightness in the throat or chest. °· You have abdominal pain. °These problems may be the first sign of a life-threatening allergic reaction. Call your local emergency services (911 in [place] °MAKE SURE YOU:  °· Understand these instructions. °· Will watch your condition. °· Will get help right away if you are not doing well or get worse. °Document Released: 01/09/2005 Document Revised: 01/14/2013 Document Reviewed: 04/04/2011 °ExitCare® Patient Information ©2015 [employer]. This information is not intended to replace advice given to you by your health care provider. Make sure you discuss any questions you have with your health care provider. ° ° ° °Epinephrine Injection °Epinephrine is a medicine given by injection to temporarily treat an emergency allergic reaction. It is also used to treat severe asthmatic attacks and other lung problems. The medicine helps to enlarge (dilate) the small breathing tubes of the lungs. A life-threatening, sudden allergic reaction that involves the whole body is called anaphylaxis. Because of potential side effects, epinephrine should only be used as directed by your caregiver. °RISKS AND COMPLICATIONS °Possible side effects of epinephrine injections include: °· Chest pain. °· Irregular or rapid heartbeat. °· Shortness of breath. °· Nausea. °· Vomiting. °· Abdominal pain or cramping. °· Sweating. °· Dizziness. °· Weakness. °· Headache. °· Nervousness. °Report all side effects to your caregiver. °HOW TO GIVE AN EPINEPHRINE INJECTION °Give the epinephrine injection immediately when symptoms of a severe reaction begin. Inject the medicine into the outer thigh or any available, large muscle. Your caregiver can teach you how to do this. You do not need to   remove any clothing. After the injection, call your local emergency services (911 in [place] Even if you improve after the injection, you need to be examined at a hospital emergency  department. Epinephrine works quickly, but it also wears off quickly. Delayed reactions can occur. A delayed reaction may be as serious and dangerous as the initial reaction. °HOME CARE INSTRUCTIONS °· Make sure you and your family know how to give an epinephrine injection. °· Use epinephrine injections as directed by your caregiver. Do not use this medicine more often or in larger doses than prescribed. °· Always carry your epinephrine injection or anaphylaxis kit with you. This can be lifesaving if you have a severe reaction. °· Store the medicine in a cool, dry place. If the medicine becomes discolored or cloudy, dispose of it properly and replace it with new medicine. °· Check the expiration date on your medicine. It may be unsafe to use medicines past their expiration date. °· Tell your caregiver about any other medicines you are taking. Some medicines can react badly with epinephrine. °· Tell your caregiver about any medical conditions you have, such as diabetes, high blood pressure (hypertension), heart disease, irregular heartbeats, or if you are pregnant. °SEEK IMMEDIATE MEDICAL CARE IF: °· You have used an epinephrine injection. Call your local emergency services (911 in [place] Even if you improve after the injection, you need to be examined at a hospital emergency department to make sure your allergic reaction is under control. You will also be monitored for adverse effects from the medicine. °· You have chest pain. °· You have irregular or fast heartbeats. °· You have shortness of breath. °· You have severe headaches. °· You have severe nausea, vomiting, or abdominal cramps. °· You have severe pain, swelling, or redness in the area where you gave the injection. °Document Released: 01/07/2000 Document Revised: 04/03/2011 Document Reviewed: 09/28/2010 °ExitCare® Patient Information ©2015 [employer]. This information is not intended to replace advice given to you by your health care provider. Make sure  you discuss any questions you have with your health care provider. ° ° °

## 2013-11-10 NOTE — ED Provider Notes (Signed)
Medical screening examination/treatment/procedure(s) were performed by a resident physician or non-physician practitioner and as the supervising physician I was immediately available for consultation/collaboration.  Lynne Leader, MD    Gregor Hams, MD 11/10/13 [phone]

## 2014-07-15 ENCOUNTER — Encounter: Payer: Self-pay | Admitting: Gastroenterology

## 2014-09-02 ENCOUNTER — Encounter: Payer: Self-pay | Admitting: Gastroenterology

## 2016-01-24 HISTORY — PX: COLONOSCOPY: SHX174

## 2016-03-01 ENCOUNTER — Encounter: Payer: Self-pay | Admitting: Internal Medicine

## 2016-04-17 ENCOUNTER — Encounter (INDEPENDENT_AMBULATORY_CARE_PROVIDER_SITE_OTHER): Payer: Self-pay

## 2016-04-17 ENCOUNTER — Ambulatory Visit (AMBULATORY_SURGERY_CENTER): Payer: Self-pay | Admitting: *Deleted

## 2016-04-17 VITALS — Ht 70.0 in | Wt 175.0 lb

## 2016-04-17 DIAGNOSIS — Z1211 Encounter for screening for malignant neoplasm of colon: Secondary | ICD-10-CM

## 2016-04-17 NOTE — Progress Notes (Signed)
Patient denies any allergies to eggs or soy. Patient denies any problems with anesthesia/sedation. Patient denies any oxygen use at home and does not take any diet/weight loss medications. EMMI education declined by the patient.

## 2016-04-18 ENCOUNTER — Encounter: Payer: Self-pay | Admitting: Internal Medicine

## 2016-05-01 ENCOUNTER — Ambulatory Visit (AMBULATORY_SURGERY_CENTER): Payer: BLUE CROSS/BLUE SHIELD | Admitting: Internal Medicine

## 2016-05-01 ENCOUNTER — Encounter: Payer: Self-pay | Admitting: Internal Medicine

## 2016-05-01 VITALS — BP 129/79 | HR 66 | Temp 98.4°F | Resp 16 | Ht 70.0 in | Wt 175.0 lb

## 2016-05-01 DIAGNOSIS — D122 Benign neoplasm of ascending colon: Secondary | ICD-10-CM | POA: Diagnosis not present

## 2016-05-01 DIAGNOSIS — D126 Benign neoplasm of colon, unspecified: Secondary | ICD-10-CM

## 2016-05-01 DIAGNOSIS — D120 Benign neoplasm of cecum: Secondary | ICD-10-CM | POA: Diagnosis not present

## 2016-05-01 DIAGNOSIS — Z1211 Encounter for screening for malignant neoplasm of colon: Secondary | ICD-10-CM | POA: Diagnosis present

## 2016-05-01 DIAGNOSIS — D124 Benign neoplasm of descending colon: Secondary | ICD-10-CM

## 2016-05-01 DIAGNOSIS — K635 Polyp of colon: Secondary | ICD-10-CM | POA: Diagnosis not present

## 2016-05-01 DIAGNOSIS — Z1212 Encounter for screening for malignant neoplasm of rectum: Secondary | ICD-10-CM | POA: Diagnosis not present

## 2016-05-01 DIAGNOSIS — D125 Benign neoplasm of sigmoid colon: Secondary | ICD-10-CM

## 2016-05-01 DIAGNOSIS — D123 Benign neoplasm of transverse colon: Secondary | ICD-10-CM

## 2016-05-01 MED ORDER — SODIUM CHLORIDE 0.9 % IV SOLN: 500.0000 mL | INTRAVENOUS | Status: DC

## 2016-05-01 NOTE — Progress Notes (Signed)
Report given to PACU, vss 

## 2016-05-01 NOTE — Patient Instructions (Addendum)
I found and removed 7 polyps that all look benign. I will let you know pathology results and when to have another routine colonoscopy by mail and/or My Chart.  You also have a condition called diverticulosis - common and not usually a problem. Please read the handout provided.  I appreciate the opportunity to care for you. Gatha Mayer, MD, FACG   YOU HAD AN ENDOSCOPIC PROCEDURE TODAY AT [facility]:   Refer to the procedure report that was given to you for any specific questions about what was found during the examination.  If the procedure report does not answer your questions, please call your gastroenterologist to clarify.  If you requested that your care partner not be given the details of your procedure findings, then the procedure report has been included in a sealed envelope for you to review at your convenience later.  YOU SHOULD EXPECT: Some feelings of bloating in the abdomen. Passage of more gas than usual.  Walking can help get rid of the air that was put into your GI tract during the procedure and reduce the bloating. If you had a lower endoscopy (such as a colonoscopy or flexible sigmoidoscopy) you may notice spotting of blood in your stool or on the toilet paper. If you underwent a bowel prep for your procedure, you may not have a normal bowel movement for a few days.  Please Note:  You might notice some irritation and congestion in your nose or some drainage.  This is from the oxygen used during your procedure.  There is no need for concern and it should clear up in a day or so.  SYMPTOMS TO REPORT IMMEDIATELY:   Following lower endoscopy (colonoscopy or flexible sigmoidoscopy):  Excessive amounts of blood in the stool  Significant tenderness or worsening of abdominal pains  Swelling of the abdomen that is new, acute  Fever of 100F or higher   Following upper endoscopy (EGD)  Vomiting of blood or coffee ground material  New chest pain or pain  under the shoulder blades  Painful or persistently difficult swallowing  New shortness of breath  Fever of 100F or higher  Black, tarry-looking stools  For urgent or emergent issues, a gastroenterologist can be reached at any hour by calling [phone].   DIET:  We do recommend a small meal at first, but then you may proceed to your regular diet.  Drink plenty of fluids but you should avoid alcoholic beverages for 24 hours.  ACTIVITY:  You should plan to take it easy for the rest of today and you should NOT DRIVE or use heavy machinery until tomorrow (because of the sedation medicines used during the test).    FOLLOW UP: Our staff will call the number listed on your records the next business day following your procedure to check on you and address any questions or concerns that you may have regarding the information given to you following your procedure. If we do not reach you, we will leave a message.  However, if you are feeling well and you are not experiencing any problems, there is no need to return our call.  We will assume that you have returned to your regular daily activities without incident.  If any biopsies were taken you will be contacted by phone or by letter within the next 1-3 weeks.  Please call us at [phone] if you have not heard about the biopsies in 3 weeks.    SIGNATURES/CONFIDENTIALITY: You and/or  your care partner have signed paperwork which will be entered into your electronic medical record.  These signatures attest to the fact that that the information above on your After Visit Summary has been reviewed and is understood.  Full responsibility of the confidentiality of this discharge information lies with you and/or your care-partner.  Information on polyps given to you today  A card to carry in your wallet was given to you today ( indicating 2 clips were placed in your colon )

## 2016-05-01 NOTE — Op Note (Signed)
[facility] Patient Name: Johnson Arizola Procedure Date: 05/01/2016 1:24 PM MRN: [id number] Endoscopist: Gatha Mayer , MD Age: 61 Referring MD:  Date of Birth: 11-Jan-1956 Gender: Male Account #: [id number] Procedure:                Colonoscopy Indications:              Screening for colorectal malignant neoplasm, This                            is the patient's first colonoscopy Medicines:                Propofol per Anesthesia, Monitored Anesthesia Care Procedure:                Pre-Anesthesia Assessment:                           - Prior to the procedure, a History and Physical                            was performed, and patient medications and                            allergies were reviewed. The patient's tolerance of                            previous anesthesia was also reviewed. The risks                            and benefits of the procedure and the sedation                            options and risks were discussed with the patient.                            All questions were answered, and informed consent                            was obtained. Prior Anticoagulants: The patient has                            taken no previous anticoagulant or antiplatelet                            agents. ASA Grade Assessment: I - A normal, healthy                            patient. After reviewing the risks and benefits,                            the patient was deemed in satisfactory condition to                            undergo the procedure.  After obtaining informed consent, the colonoscope                            was passed under direct vision. Throughout the                            procedure, the patient's blood pressure, pulse, and                            oxygen saturations were monitored continuously. The                            Colonoscope was introduced through the anus and                            advanced to the  the cecum, identified by                            appendiceal orifice and ileocecal valve. The                            colonoscopy was performed without difficulty. The                            patient tolerated the procedure well. The quality                            of the bowel preparation was good. The ileocecal                            valve, appendiceal orifice, and rectum were                            photographed. The bowel preparation used was                            Miralax. Scope In: 1:29:56 PM Scope Out: 2:01:30 PM Scope Withdrawal Time: 0 hours 28 minutes 52 seconds  Total Procedure Duration: 0 hours 31 minutes 34 seconds  Findings:                 The perianal and digital rectal examinations were                            normal. Pertinent negatives include normal prostate                            (size, shape, and consistency).                           A 12 mm polyp was found in the sigmoid colon. The                            polyp was pedunculated. The polyp was removed with  a hot snare. Resection and retrieval were complete.                            To prevent bleeding after the polypectomy, two                            hemostatic clips were successfully placed (MR                            conditional). There was no bleeding during, or at                            the end, of the procedure. Estimated blood loss:                            none.                           Five sessile polyps were found in the descending                            colon, transverse colon, ascending colon and cecum.                            The polyps were diminutive in size. These polyps                            were removed with a cold biopsy forceps. Resection                            and retrieval were complete. Verification of                            patient identification for the specimen was done.                             Estimated blood loss was minimal.                           A diminutive polyp was found in the sigmoid colon.                            The polyp was sessile. The polyp was removed with a                            cold snare. Resection and retrieval were complete.                            Verification of patient identification for the                            specimen was done. Estimated blood loss was minimal.  Many small-mouthed diverticula were found in the                            entire colon.                           Internal hemorrhoids were found during retroflexion.                           The exam was otherwise without abnormality on                            direct and retroflexion views. Complications:            No immediate complications. Estimated Blood Loss:     Estimated blood loss was minimal. Impression:               - One 12 mm polyp in the sigmoid colon, removed                            with a hot snare. Resected and retrieved. Clips (MR                            conditional) were placed.                           - Five diminutive polyps in the descending colon,                            in the transverse colon, in the ascending colon and                            in the cecum, removed with a cold biopsy forceps.                            Resected and retrieved.                           - One diminutive polyp in the sigmoid colon,                            removed with a cold snare. Resected and retrieved.                           - Diverticulosis in the entire examined colon.                           - Internal hemorrhoids.                           - The examination was otherwise normal on direct                            and retroflexion views. Recommendation:           - Patient has a contact number available for  emergencies. The signs and symptoms of potential                             delayed complications were discussed with the                            patient. Return to normal activities tomorrow.                            Written discharge instructions were provided to the                            patient.                           - Resume previous diet.                           - Continue present medications.                           - No aspirin, ibuprofen, naproxen, or other                            non-steroidal anti-inflammatory drugs for 2 weeks                            after polyp removal.                           - Repeat colonoscopy is recommended. The                            colonoscopy date will be determined after pathology                            results from today's exam become available for                            review. Gatha Mayer, MD 05/01/2016 2:11:34 PM This report has been signed electronically.

## 2016-05-01 NOTE — Progress Notes (Signed)
Called to room to assist during endoscopic procedure.  Patient ID and intended procedure confirmed with present staff. Received instructions for my participation in the procedure from the performing physician.  

## 2016-05-02 ENCOUNTER — Telehealth: Payer: Self-pay | Admitting: Internal Medicine

## 2016-05-02 ENCOUNTER — Encounter (HOSPITAL_COMMUNITY)
Admission: RE | Disposition: A | Discharge status: Home / Self Care | Payer: Self-pay | Source: Ambulatory Visit | Attending: Gastroenterology

## 2016-05-02 ENCOUNTER — Encounter (HOSPITAL_COMMUNITY): Payer: Self-pay | Admitting: Gastroenterology

## 2016-05-02 ENCOUNTER — Ambulatory Visit (HOSPITAL_COMMUNITY)
Admission: RE | Admit: 2016-05-02 | Discharge: 2016-05-02 | Disposition: A | Discharge status: Home / Self Care | Payer: BLUE CROSS/BLUE SHIELD | Source: Ambulatory Visit | Attending: Gastroenterology | Admitting: Gastroenterology

## 2016-05-02 ENCOUNTER — Ambulatory Visit: Payer: Self-pay | Admitting: Internal Medicine

## 2016-05-02 ENCOUNTER — Other Ambulatory Visit: Payer: Self-pay

## 2016-05-02 ENCOUNTER — Telehealth: Payer: Self-pay | Admitting: *Deleted

## 2016-05-02 DIAGNOSIS — Z91018 Allergy to other foods: Secondary | ICD-10-CM | POA: Diagnosis not present

## 2016-05-02 DIAGNOSIS — F17210 Nicotine dependence, cigarettes, uncomplicated: Secondary | ICD-10-CM | POA: Insufficient documentation

## 2016-05-02 DIAGNOSIS — K91840 Postprocedural hemorrhage and hematoma of a digestive system organ or structure following a digestive system procedure: Secondary | ICD-10-CM | POA: Diagnosis not present

## 2016-05-02 DIAGNOSIS — K922 Gastrointestinal hemorrhage, unspecified: Secondary | ICD-10-CM

## 2016-05-02 HISTORY — PX: FLEXIBLE SIGMOIDOSCOPY: SHX5431

## 2016-05-02 LAB — CBC
HCT: 38 % — ABNORMAL LOW (ref 39.0–52.0)
Hemoglobin: 13.1 g/dL (ref 13.0–17.0)
MCH: 35.3 pg — ABNORMAL HIGH (ref 26.0–34.0)
MCHC: 34.5 g/dL (ref 30.0–36.0)
MCV: 102.4 fL — ABNORMAL HIGH (ref 78.0–100.0)
Platelets: 251 10*3/uL (ref 150–400)
RBC: 3.71 MIL/uL — ABNORMAL LOW (ref 4.22–5.81)
RDW: 12.8 % (ref 11.5–15.5)
WBC: 11.5 10*3/uL — ABNORMAL HIGH (ref 4.0–10.5)

## 2016-05-02 SURGERY — SIGMOIDOSCOPY, FLEXIBLE
Anesthesia: Moderate Sedation

## 2016-05-02 SURGERY — Surgical Case
Anesthesia: *Unknown

## 2016-05-02 MED ORDER — FENTANYL CITRATE (PF) 100 MCG/2ML IJ SOLN
INTRAMUSCULAR | Status: AC
  Filled 2016-05-02: qty 2

## 2016-05-02 MED ORDER — SODIUM CHLORIDE 0.9 % IV SOLN: INTRAVENOUS | Status: DC

## 2016-05-02 MED ORDER — MIDAZOLAM HCL 10 MG/2ML IJ SOLN
INTRAMUSCULAR | Status: DC | PRN
  Administered 2016-05-02 (×2): 2 mg via INTRAVENOUS
  Administered 2016-05-02: 1 mg via INTRAVENOUS

## 2016-05-02 MED ORDER — FENTANYL CITRATE (PF) 100 MCG/2ML IJ SOLN
INTRAMUSCULAR | Status: DC | PRN
  Administered 2016-05-02: 25 ug via INTRAVENOUS
  Administered 2016-05-02: 50 ug via INTRAVENOUS
  Administered 2016-05-02: 25 ug via INTRAVENOUS

## 2016-05-02 MED ORDER — FLEET ENEMA 7-19 GM/118ML RE ENEM
1.0000 | ENEMA | RECTAL | Status: DC
  Administered 2016-05-02: 1 via RECTAL

## 2016-05-02 MED ORDER — MIDAZOLAM HCL 5 MG/ML IJ SOLN
INTRAMUSCULAR | Status: AC
  Filled 2016-05-02: qty 2

## 2016-05-02 MED ORDER — FLEET ENEMA 7-19 GM/118ML RE ENEM
ENEMA | RECTAL | Status: AC
  Filled 2016-05-02: qty 1

## 2016-05-02 MED ORDER — FLEET ENEMA 7-19 GM/118ML RE ENEM: 2.0000 | ENEMA | RECTAL | Status: DC

## 2016-05-02 NOTE — Telephone Encounter (Signed)
Please see message. Pt advised to seek care at ED.

## 2016-05-02 NOTE — Telephone Encounter (Signed)
He should go to ED - I will alert Dr. Ardis Hughs to see if they can just do a flex sig and fix it

## 2016-05-02 NOTE — Telephone Encounter (Signed)
[facility]  Patient Name: Joseph Mccoy  DOB: 1955-10-18    Initial Comment Caller states, he has two clips on something to remove polyp and had a colonoscopy. Having rectal bleeding. Verified    Nurse Assessment  Nurse: Wynetta Emery, RN, Baker Janus Date/Time Eilene Ghazi Time): 05/02/2016 8:18:36 AM  Confirm and document reason for call. If symptomatic, describe symptoms. ---[name] had colonoscopy done 130pm yesterday and last evening had rectal bleeding heavy bleeding; removal of ployp's done had to put two clips on a large one that was removed.  Does the patient have any new or worsening symptoms? ---Yes  Will a triage be completed? ---Yes  Related visit to physician within the last 2 weeks? ---No  Does the PT have any chronic conditions? (i.e. diabetes, asthma, etc.) ---No  Is this a behavioral health or substance abuse call? ---No     Guidelines    Guideline Title Affirmed Question Affirmed Notes  Rectal Bleeding [1] Colonoscopy AND [2] in past 72 hours    Final Disposition User   Go to ED Now (or PCP triage) Wynetta Emery, RN, Baker Janus    Referrals  REFERRED TO PCP OFFICE  appt with Dutch Quint at 1045am  Disagree/Comply: Comply

## 2016-05-02 NOTE — Telephone Encounter (Signed)
I spoke to the patient and Dr. Ardis Hughs Not currently bleeding  Plan is for him to get an appointment at [facility] endo unit for flex sig w/ sedation - I suspect moderate would be ok and Dr. Ardis Hughs will evaluate and treat the bleeding  Patient will need 2 enemas in endo unit prior to flex sig  Please coordinate - patient lives in [place] and is getting a friend to bring him

## 2016-05-02 NOTE — Telephone Encounter (Signed)
See TE from today- pt was sent to ED per D.O.

## 2016-05-02 NOTE — H&P (Signed)
  HPI: This is a 61 yo man   Chief complaint is rectal bleeding after colonoscopy yesterday.  He is tired but otherwise feels well.  Multiple bloody BMs starting last night.    ROS: complete GI ROS as described in HPI.  Constitutional:  No unintentional weight loss   Past Medical History:  Diagnosis Date  . Anaphylactic reaction    Meat - alpha gal    Past Surgical History:  Procedure Laterality Date  . ANKLE FRACTURE SURGERY    . PROSTATE SURGERY    . RHINOPLASTY      Current Facility-Administered Medications  Medication Dose Route Frequency Provider Last Rate Last Dose  . 0.9 %  sodium chloride infusion   Intravenous Continuous Milus Banister, MD      . sodium phosphate (FLEET) 7-19 GM/118ML enema 1 enema  1 enema Rectal UD Milus Banister, MD        Allergies as of 05/02/2016 - Review Complete 05/02/2016  Allergen Reaction Noted  . Ham Anaphylaxis 11/09/2013  . Other  11/09/2013    Family History  Problem Relation Age of Onset  . Colon cancer Neg Hx     Social History   Social History  . Marital status: Single    Spouse name: N/A  . Number of children: N/A  . Years of education: N/A   Occupational History  . Not on file.   Social History Main Topics  . Smoking status: Current Every Day Smoker    Packs/day: 0.50    Types: Cigarettes  . Smokeless tobacco: Never Used  . Alcohol use 9.6 oz/week    12 Cans of beer, 4 Shots of liquor per week  . Drug use: No  . Sexual activity: Not on file   Other Topics Concern  . Not on file   Social History Narrative  . No narrative on file     Physical Exam: There were no vitals taken for this visit. Constitutional: generally well-appearing Psychiatric: alert and oriented x3 Abdomen: soft, nontender, nondistended, no obvious ascites, no peritoneal signs, normal bowel sounds No peripheral edema noted in lower extremities  Assessment and plan: 61 y.o. male with post polypectomy bleeding  Target site in  sigmoid.  Planning flex sig this morning after single enema.  Getting CBC as well.  Please see the "Patient Instructions" section for addition details about the plan.  Owens Loffler, MD [facility] 05/02/2016, 11:09 AM

## 2016-05-02 NOTE — Telephone Encounter (Signed)
I spoke with Joseph Mccoy at [facility].  They will try and work the patient in.  He needs to reports to [facility] admitting. Patient is notified to proceed to [facility] admitting, that he will need a driver, and that he should be NPO.  He verbalized understanding.

## 2016-05-02 NOTE — Telephone Encounter (Signed)
No answer and the patient's mailbox is full. I will continue to try and reach the patient.  Dr. Carlean Purl see the phone note from primary care, looks like he might be on the way to the ED.

## 2016-05-02 NOTE — Telephone Encounter (Signed)
  Follow up Call-  Call back number 05/01/2016  Post procedure Call Back phone  # [phone]  Permission to leave phone message Yes  Some recent data might be hidden     Patient questions:  Do you have a fever, pain , or abdominal swelling? No. Pain Score  0 *  Have you tolerated food without any problems? Yes.    Have you been able to return to your normal activities? Yes.    Do you have any questions about your discharge instructions: Diet   No. Medications  No. Follow up visit  No.  Do you have questions or concerns about your Care? No.  Actions: * If pain score is 4 or above: No action needed, pain <4.  Pt states he has been bleeding t/o the noc, "like when I took the prep."  No clots passed, no abdominal pain or fever.  Dr. Carlean Purl made aware

## 2016-05-02 NOTE — Discharge Instructions (Signed)

## 2016-05-02 NOTE — Op Note (Signed)
[facility] Patient Name: Joseph Mccoy Procedure Date : 05/02/2016 MRN: [id number] Attending MD: Milus Banister , MD Date of Birth: 10-16-1955 CSN: [id number] Age: 60 Admit Type: Inpatient Procedure:                Flexible Sigmoidoscopy Indications:              Treatment of bleeding from polypectomy site Providers:                Milus Banister, MD, Vista Lawman, RN, Cherylynn Ridges, Technician Referring MD:             Silvano Rusk, MD Medicines:                Fentanyl 100 micrograms IV, Midazolam 5 mg IV Complications:            No immediate complications. Estimated blood loss:                            None. Estimated Blood Loss:     Estimated blood loss: none. Procedure:                Pre-Anesthesia Assessment:                           - Prior to the procedure, a History and Physical                            was performed, and patient medications and                            allergies were reviewed. The patient's tolerance of                            previous anesthesia was also reviewed. The risks                            and benefits of the procedure and the sedation                            options and risks were discussed with the patient.                            All questions were answered, and informed consent                            was obtained. Prior Anticoagulants: The patient has                            taken no previous anticoagulant or antiplatelet                            agents. ASA Grade Assessment: II - A patient with  mild systemic disease. After reviewing the risks                            and benefits, the patient was deemed in                            satisfactory condition to undergo the procedure.                           After obtaining informed consent, the scope was                            passed under direct vision. The EC-[id number] ([id number])                  scope was introduced through the anus and advanced                            to the the sigmoid colon. The flexible                            sigmoidoscopy was accomplished without difficulty.                            The patient tolerated the procedure well. The                            quality of the bowel preparation was good. Scope In: 12:51:26 PM Scope Out: 1:08:47 PM Total Procedure Duration: 0 hours 17 minutes 21 seconds  Findings:      There was a small amount of fresh red blood in the visualized colon. The       site of yesterday's polypectomy, clipping was well visualized. This was       engorged, there were a couple small adherent clots at the tip. There was       no active bleeding but this was clearly the site of bleeding the past 12       hours. I placed two (MRI conditional) clips at the site, across the       'neck.' Impression:               - Site of previous polypectomy was located; it was                            engorged and there were small clots adherent. I                            placed two additional clips at the site, across the                            'neck' of the process. I think it is unlikely to                            bleed again. Recommendation:           - Patient has a contact number available for  emergencies. The signs and symptoms of potential                            delayed complications were discussed with the                            patient. Return to normal activities tomorrow.                            Written discharge instructions were provided to the                            patient.                           - Resume regular diet.                           - Continue present medications. Procedure Code(s):        --- Professional ---                           [phone], Sigmoidoscopy, flexible; with control of                            bleeding, any method Diagnosis Code(s):         --- Professional ---                           Y70.929, Postprocedural hemorrhage of a digestive                            system organ or structure following a digestive                            system procedure CPT copyright 2016 American Medical Association. All rights reserved. The codes documented in this report are preliminary and upon coder review may  be revised to meet current compliance requirements. Milus Banister, MD 05/02/2016 1:17:11 PM This report has been signed electronically. Number of Addenda: 0

## 2016-05-04 ENCOUNTER — Telehealth: Payer: Self-pay | Admitting: Internal Medicine

## 2016-05-04 NOTE — Telephone Encounter (Signed)
No more bleeding after second set of clips on polyp stalk He was appreciative of the help we provided with the post-polypectomy bleeding complication

## 2016-05-10 ENCOUNTER — Encounter: Payer: Self-pay | Admitting: Internal Medicine

## 2016-05-10 DIAGNOSIS — Z860101 Personal history of adenomatous and serrated colon polyps: Secondary | ICD-10-CM | POA: Insufficient documentation

## 2016-05-10 DIAGNOSIS — Z86010 Personal history of colonic polyps: Secondary | ICD-10-CM

## 2016-05-10 HISTORY — DX: Personal history of colonic polyps: Z86.010

## 2016-05-10 HISTORY — DX: Personal history of adenomatous and serrated colon polyps: Z86.0101

## 2016-05-10 NOTE — Progress Notes (Signed)
3 adenomas max 12 mm recall 2021

## 2018-01-29 LAB — HM COLONOSCOPY

## 2018-06-18 ENCOUNTER — Other Ambulatory Visit: Payer: Self-pay | Admitting: Internal Medicine

## 2018-06-18 ENCOUNTER — Ambulatory Visit
Admission: RE | Admit: 2018-06-18 | Discharge: 2018-06-18 | Disposition: A | Discharge status: Home / Self Care | Payer: BC Managed Care – PPO | Source: Ambulatory Visit | Attending: Internal Medicine | Admitting: Internal Medicine

## 2018-06-18 DIAGNOSIS — Z720 Tobacco use: Secondary | ICD-10-CM

## 2018-06-18 DIAGNOSIS — R05 Cough: Secondary | ICD-10-CM

## 2018-06-18 DIAGNOSIS — R053 Chronic cough: Secondary | ICD-10-CM

## 2018-06-19 ENCOUNTER — Ambulatory Visit
Admission: RE | Admit: 2018-06-19 | Discharge: 2018-06-19 | Disposition: A | Discharge status: Home / Self Care | Payer: BC Managed Care – PPO | Source: Ambulatory Visit | Attending: Internal Medicine | Admitting: Internal Medicine

## 2018-06-19 ENCOUNTER — Other Ambulatory Visit: Payer: Self-pay | Admitting: Internal Medicine

## 2018-06-19 DIAGNOSIS — K7689 Other specified diseases of liver: Secondary | ICD-10-CM

## 2018-06-19 MED ORDER — IOPAMIDOL (ISOVUE-300) INJECTION 61%
100.0000 mL | Freq: Once | INTRAVENOUS | Status: AC | PRN
  Administered 2018-06-19: 12:00:00 100 mL via INTRAVENOUS

## 2018-06-26 ENCOUNTER — Other Ambulatory Visit: Payer: Self-pay | Admitting: Internal Medicine

## 2018-06-26 DIAGNOSIS — K7689 Other specified diseases of liver: Secondary | ICD-10-CM

## 2018-07-13 ENCOUNTER — Other Ambulatory Visit: Payer: Self-pay

## 2018-07-13 ENCOUNTER — Ambulatory Visit
Admission: RE | Admit: 2018-07-13 | Discharge: 2018-07-13 | Disposition: A | Discharge status: Home / Self Care | Payer: BC Managed Care – PPO | Source: Ambulatory Visit | Attending: Internal Medicine | Admitting: Internal Medicine

## 2018-07-13 DIAGNOSIS — K7689 Other specified diseases of liver: Secondary | ICD-10-CM

## 2018-07-15 ENCOUNTER — Other Ambulatory Visit: Payer: Self-pay | Admitting: Internal Medicine

## 2018-07-15 DIAGNOSIS — K7689 Other specified diseases of liver: Secondary | ICD-10-CM

## 2018-08-09 ENCOUNTER — Other Ambulatory Visit: Payer: BC Managed Care – PPO

## 2018-08-10 ENCOUNTER — Other Ambulatory Visit: Payer: Self-pay

## 2018-08-10 ENCOUNTER — Ambulatory Visit
Admission: RE | Admit: 2018-08-10 | Discharge: 2018-08-10 | Disposition: A | Discharge status: Home / Self Care | Payer: BC Managed Care – PPO | Source: Ambulatory Visit | Attending: Internal Medicine | Admitting: Internal Medicine

## 2018-08-10 DIAGNOSIS — K7689 Other specified diseases of liver: Secondary | ICD-10-CM

## 2018-08-10 MED ORDER — GADOBENATE DIMEGLUMINE 529 MG/ML IV SOLN
15.0000 mL | Freq: Once | INTRAVENOUS | Status: AC | PRN
  Administered 2018-08-10: 15 mL via INTRAVENOUS

## 2018-11-26 ENCOUNTER — Other Ambulatory Visit: Payer: Self-pay | Admitting: Surgery

## 2018-11-26 DIAGNOSIS — R1900 Intra-abdominal and pelvic swelling, mass and lump, unspecified site: Secondary | ICD-10-CM

## 2018-12-03 ENCOUNTER — Encounter: Payer: Self-pay | Admitting: Physical Medicine and Rehabilitation

## 2018-12-03 ENCOUNTER — Ambulatory Visit: Payer: BC Managed Care – PPO | Admitting: Physical Medicine and Rehabilitation

## 2018-12-03 ENCOUNTER — Other Ambulatory Visit: Payer: Self-pay

## 2018-12-03 VITALS — BP 131/81 | HR 69

## 2018-12-03 DIAGNOSIS — M25551 Pain in right hip: Secondary | ICD-10-CM

## 2018-12-03 DIAGNOSIS — M545 Low back pain: Secondary | ICD-10-CM | POA: Diagnosis not present

## 2018-12-03 DIAGNOSIS — M47816 Spondylosis without myelopathy or radiculopathy, lumbar region: Secondary | ICD-10-CM

## 2018-12-03 DIAGNOSIS — G8929 Other chronic pain: Secondary | ICD-10-CM

## 2018-12-03 DIAGNOSIS — M7918 Myalgia, other site: Secondary | ICD-10-CM | POA: Diagnosis not present

## 2018-12-03 DIAGNOSIS — M5136 Other intervertebral disc degeneration, lumbar region: Secondary | ICD-10-CM | POA: Diagnosis not present

## 2018-12-03 NOTE — Progress Notes (Signed)
Joseph Mccoy - 63 y.o. male MRN [id number]  Date of birth: 1955/04/21  Office Visit Note: Visit Date: 12/03/2018 PCP: Leanna Battles, MD Referred by: Leanna Battles, MD  Subjective: Chief Complaint  Patient presents with   Middle Back - Pain   Right Hip - Pain   Lower Back - Pain   HPI: Joseph Mccoy is a 63 y.o. male who comes in today At the request of Dr. Janie Morning for consultation and evaluation management of chronic worsening right-sided low back pain right at the iliac crest with some referral pattern more towards the middle of the back.  Interestingly the referral from Dr. Sharlett Iles was for thoracic pain.  The patient has had CT scan of the thoracic spine which is reviewed.  He is also had MRI of the abdomen.  No specific other images of the lumbar spine specifically.  The patient reports to me 8 out of 10 low back pain to the right really points over the right iliac crest and refers to his hip pain.  He reports several months now of worsening pain particularly at work.  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  He reports with the latter on the table and standing and standing the latter he will get a lot of pain in the right lower back.  Most of his pain occurs if he has been sitting for a long time he tries to stand up.  He also gets some pain particularly with standing for a long time.  He is not really getting any radicular pain down the legs or paresthesia.  Reports occasional referral to the anterior part of the thigh may be the groin.  He reports that a assistant at Dr. Buel Ream office gave him a recent Toradol injection that seemed to help to some degree.  He was given prednisone taper.  He has had muscle relaxers without much relief.  He has seen a chiropractor in the past without much relief.  He has not had specific physical therapy.  He is a fairly fit individual and  does like to lift weights.  He has been laying off that recently because of the back pain.  He denies any specific injury or focal weakness.  He is wondering about what to do at work because his pain is pretty severe lately trying to do some of the projects at work.  Review of Systems  Constitutional: Negative for chills, fever, malaise/fatigue and weight loss.  HENT: Negative for hearing loss and sinus pain.   Eyes: Negative for blurred vision, double vision and photophobia.  Respiratory: Negative for cough and shortness of breath.   Cardiovascular: Negative for chest pain, palpitations and leg swelling.  Gastrointestinal: Negative for abdominal pain, nausea and vomiting.  Genitourinary: Negative for flank pain.  Musculoskeletal: Positive for back pain and joint pain. Negative for myalgias.  Skin: Negative for itching and rash.  Neurological: Negative for tingling, tremors, focal weakness and weakness.  Endo/Heme/Allergies: Negative.   Psychiatric/Behavioral: Negative for depression.  All other systems reviewed and are negative.  Otherwise per HPI.  Assessment & Plan: Visit Diagnoses:  1. Myofascial pain syndrome   2. 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 Spondylosis without myelopathy or radiculopathy, lumbar region   4. Other intervertebral disc degeneration, lumbar region   5. Pain in right hip     Plan: Findings:  Clinically  he appears to be having pain related to a combination of facet arthritis particularly at the L4-5 and L5-S1 level combined with some degenerative disc height loss which by itself is not usually a big issue and I think he could be getting some nerve root irritation at that level even though he does not have any referral pain down the leg.  Conversely does have a focal trigger point in the paraspinal region tie-in of the latissimus dorsi and quadratus lumborum.  Trigger points were completed in this area.  He is not really fond of injections but did  well.  Medication wise we talked about the use of anti-inflammatory medications and activity modification.  I requested that he take breaks at work if possible.  He says he is not on any timeframes to do the projects.  I tried to tell him it somewhat commonsense at times to try to change positions or move things around to see if we get in a better mechanical advantage and he seemed to understand this.  Depending on relief with the trigger point injection would look at epidural injection.  If he is not getting anywhere at that point would look at probably dedicated lumbar spine MRI.  I did review the images that we have and it appears that the hips look okay there may be some mild changes on the MRI on the right hip.  He had no pain with rotation of the right hip.  He also does have significant degenerative disc height loss on the CT scan of the thoracic spine in the low cervical high thoracic area but he is not really pointing of any pain in that area.  We also talked about lipomas which she has had a history of a lipoma.  He recently saw Dr. Georgette Dover who recommended CT scan of the abdomen pelvis with contrast and the patient is unsure he wants to do that.  I tried to express to him that the pain he is having is not related to the lipoma but I have no expertise other than lipomas can be removed.  He is to talk to Dr. Georgette Dover about the next step regarding that.    Meds & Orders: No orders of the defined types were placed in this encounter.   Orders Placed This Encounter  Procedures   Trigger Point Inj    Follow-up: Return for Will call us back concerning how the trigger point injection worked..   Procedures: Trigger Point Inj  Date/Time: 12/03/2018 9:48 AM Performed by: Magnus Sinning, MD Authorized by: Magnus Sinning, MD   Consent Given by:  Patient Site marked: the procedure site was marked   Timeout: prior to procedure the correct patient, procedure, and site was verified   Indications:   Pain Total # of Trigger Points:  3 or more Location: back   Needle Size:  25 G Approach:  Dorsal and lateral Medications #1:  40 mg methylPREDNISolone acetate 40 MG/ML; 3 mL lidocaine 1 % Patient tolerance:  Patient tolerated the procedure well with no immediate complications    No notes on file   Clinical History: No specialty comments available.   He reports that he has been smoking cigarettes. He has been smoking about 0.50 packs per day. He has never used smokeless tobacco. No results for input(s): HGBA1C, LABURIC in the last 8760 hours.  Objective:  VS:  HT:     WT:    BMI:      BP:131/81   HR:69bpm   TEMP: ( )  RESP:  Physical Exam Vitals signs and nursing note reviewed.  Constitutional:      General: He is not in acute distress.    Appearance: He is well-developed.  HENT:     Head: Normocephalic and atraumatic.     Nose: Nose normal.     Mouth/Throat:     Mouth: Mucous membranes are moist.     Pharynx: Oropharynx is clear.  Eyes:     Conjunctiva/sclera: Conjunctivae normal.     Pupils: Pupils are equal, round, and reactive to light.  Neck:     Musculoskeletal: Normal range of motion and neck supple.     Trachea: No tracheal deviation.  Cardiovascular:     Rate and Rhythm: Normal rate and regular rhythm.     Pulses: Normal pulses.  Pulmonary:     Effort: Pulmonary effort is normal.     Breath sounds: Normal breath sounds.  Abdominal:     General: There is no distension.     Palpations: Abdomen is soft.     Tenderness: There is no guarding or rebound.  Musculoskeletal:        General: No deformity.     Right lower leg: No edema.     Left lower leg: No edema.     Comments: Patient sits with somewhat forward flexed cervical spine.  He has some shoulder impingement bilaterally.  He has no real pain with rocking over the upper thoracic vertebra he has some pain in the lower thoracic mid back and low back over the vertebra.  He has focal trigger point in the right  paraspinal region quadratus lumborum and latissimus dorsi tie into the iliac crest and this does seem to cause him concordant pain.  He has no pain with hip rotation he has good distal strength.  Skin:    General: Skin is warm and dry.     Findings: No erythema or rash.  Neurological:     General: No focal deficit present.     Mental Status: He is alert and oriented to person, place, and time.     Motor: No abnormal muscle tone.     Coordination: Coordination normal.     Gait: Gait normal.  Psychiatric:        Mood and Affect: Mood normal.        Behavior: Behavior normal.        Thought Content: Thought content normal.     Ortho Exam Imaging: No results found.  Past Medical/Family/Surgical/Social History: Medications & Allergies reviewed per EMR, new medications updated. Patient Active Problem List   Diagnosis Date Noted   Hx of adenomatous colonic polyps 05/10/2016   Past Medical History:  Diagnosis Date   Anaphylactic reaction    Meat - alpha gal   Gastrointestinal hemorrhage after colonoscopic polypectomy    Hx of adenomatous colonic polyps 05/10/2016   Family History  Problem Relation Age of Onset   Colon cancer Neg Hx    Past Surgical History:  Procedure Laterality Date   ANKLE FRACTURE SURGERY     FLEXIBLE SIGMOIDOSCOPY N/A 05/02/2016   Procedure: FLEXIBLE SIGMOIDOSCOPY;  Surgeon: Milus Banister, MD;  Location: [facility];  Service: Endoscopy;  Laterality: N/A;   PROSTATE SURGERY     RHINOPLASTY     Social History   Occupational History   Not on file  Tobacco Use   Smoking status: Current Every Day Smoker    Packs/day: 0.50    Types: Cigarettes   Smokeless tobacco: Never Used  Substance and Sexual Activity   Alcohol use: Yes    Alcohol/week: 16.0 standard drinks    Types: 12 Cans of beer, 4 Shots of liquor per week   Drug use: No   Sexual activity: Not on file

## 2018-12-03 NOTE — Progress Notes (Signed)
  Numeric Pain Rating Scale and Functional Assessment Average Pain 8 Pain Right Now 2 My pain is intermittent and sharp Pain is worse with: sitting and some activites Pain improves with: heat/ice   In the last MONTH (on 0-10 scale) has pain interfered with the following?  1. General activity like being  able to carry out your everyday physical activities such as walking, climbing stairs, carrying groceries, or moving a chair?  Rating(8)  2. Relation with others like being able to carry out your usual social activities and roles such as  activities at home, at work and in your community. Rating(8)  3. Enjoyment of life such that you have  been bothered by emotional problems such as feeling anxious, depressed or irritable?  Rating(9)   .

## 2018-12-04 ENCOUNTER — Encounter: Payer: Self-pay | Admitting: Physical Medicine and Rehabilitation

## 2018-12-04 MED ORDER — METHYLPREDNISOLONE ACETATE 40 MG/ML IJ SUSP
40.0000 mg | INTRAMUSCULAR | Status: AC | PRN
  Administered 2018-12-03: 10:00:00 40 mg via INTRAMUSCULAR

## 2018-12-04 MED ORDER — LIDOCAINE HCL 1 % IJ SOLN
3.0000 mL | INTRAMUSCULAR | Status: AC | PRN
  Administered 2018-12-03: 3 mL

## 2019-06-02 ENCOUNTER — Encounter: Payer: Self-pay | Admitting: Internal Medicine

## 2019-07-16 ENCOUNTER — Other Ambulatory Visit: Payer: Self-pay

## 2019-07-16 ENCOUNTER — Encounter: Payer: Self-pay | Admitting: Internal Medicine

## 2019-07-16 ENCOUNTER — Ambulatory Visit (AMBULATORY_SURGERY_CENTER): Payer: Self-pay

## 2019-07-16 VITALS — Ht 70.0 in | Wt 173.2 lb

## 2019-07-16 DIAGNOSIS — Z86010 Personal history of colonic polyps: Secondary | ICD-10-CM

## 2019-07-16 NOTE — Progress Notes (Signed)
No allergies to soy or egg Pt is not on blood thinners or diet pills Denies issues with sedation/intubation Denies atrial flutter/fib Denies constipation   Emmi instructions given to pt  Pt is aware of Covid safety and care partner requirements.  

## 2019-07-30 ENCOUNTER — Ambulatory Visit (AMBULATORY_SURGERY_CENTER): Payer: BC Managed Care – PPO | Admitting: Internal Medicine

## 2019-07-30 ENCOUNTER — Other Ambulatory Visit: Payer: Self-pay

## 2019-07-30 ENCOUNTER — Encounter: Payer: Self-pay | Admitting: Internal Medicine

## 2019-07-30 VITALS — BP 100/60 | HR 66 | Temp 97.7°F | Resp 16 | Ht 70.0 in | Wt 173.0 lb

## 2019-07-30 DIAGNOSIS — Z86010 Personal history of colonic polyps: Secondary | ICD-10-CM

## 2019-07-30 DIAGNOSIS — D128 Benign neoplasm of rectum: Secondary | ICD-10-CM

## 2019-07-30 DIAGNOSIS — D123 Benign neoplasm of transverse colon: Secondary | ICD-10-CM

## 2019-07-30 DIAGNOSIS — D127 Benign neoplasm of rectosigmoid junction: Secondary | ICD-10-CM | POA: Diagnosis not present

## 2019-07-30 DIAGNOSIS — D125 Benign neoplasm of sigmoid colon: Secondary | ICD-10-CM

## 2019-07-30 MED ORDER — SODIUM CHLORIDE 0.9 % IV SOLN: 500.0000 mL | INTRAVENOUS | Status: DC

## 2019-07-30 NOTE — Op Note (Signed)
[place] Patient Name: Joseph Mccoy Procedure Date: 07/30/2019 9:07 AM MRN: [id number] Endoscopist: Gatha Mayer , MD Age: 64 Referring MD:  Date of Birth: May 26, 1955 Gender: Male Account #: [id number] Procedure:                Colonoscopy Indications:              Surveillance: Personal history of adenomatous                            polyps on last colonoscopy 3 years ago Medicines:                Propofol per Anesthesia, Monitored Anesthesia Care Procedure:                Pre-Anesthesia Assessment:                           - Prior to the procedure, a History and Physical                            was performed, and patient medications and                            allergies were reviewed. The patient's tolerance of                            previous anesthesia was also reviewed. The risks                            and benefits of the procedure and the sedation                            options and risks were discussed with the patient.                            All questions were answered, and informed consent                            was obtained. Prior Anticoagulants: The patient has                            taken no previous anticoagulant or antiplatelet                            agents. ASA Grade Assessment: II - A patient with                            mild systemic disease. After reviewing the risks                            and benefits, the patient was deemed in                            satisfactory condition to undergo the procedure.  After obtaining informed consent, the colonoscope                            was passed under direct vision. Throughout the                            procedure, the patient's blood pressure, pulse, and                            oxygen saturations were monitored continuously. The                            Colonoscope was introduced through the anus and                             advanced to the the cecum, identified by                            appendiceal orifice and ileocecal valve. The                            colonoscopy was performed without difficulty. The                            patient tolerated the procedure well. The quality                            of the bowel preparation was good. The bowel                            preparation used was Miralax via split dose                            instruction. The ileocecal valve, appendiceal                            orifice, and rectum were photographed. Scope In: 9:21:59 AM Scope Out: 9:46:45 AM Scope Withdrawal Time: 0 hours 20 minutes 0 seconds  Total Procedure Duration: 0 hours 24 minutes 46 seconds  Findings:                 The perianal and digital rectal examinations were                            normal. Pertinent negatives include normal prostate                            (size, shape, and consistency).                           Six sessile polyps were found in the rectum,                            sigmoid colon and transverse colon. The polyps were  diminutive in size. These polyps were removed with                            a cold snare. Resection and retrieval were                            complete. Verification of patient identification                            for the specimen was done. Estimated blood loss was                            minimal.                           Multiple diverticula were found in the sigmoid                            colon, descending colon and transverse colon.                           The exam was otherwise without abnormality on                            direct and retroflexion views. Complications:            No immediate complications. Estimated Blood Loss:     Estimated blood loss was minimal. Impression:               - Six diminutive polyps in the rectum, in the                            sigmoid colon and in the  transverse colon, removed                            with a cold snare. Resected and retrieved.                           - Diverticulosis in the sigmoid colon, in the                            descending colon and in the transverse colon.                           - The examination was otherwise normal on direct                            and retroflexion views.                           - Personal history of colonic polyps. 3 adenomas                            max 12 mm 2018 Recommendation:           - Patient has  a contact number available for                            emergencies. The signs and symptoms of potential                            delayed complications were discussed with the                            patient. Return to normal activities tomorrow.                            Written discharge instructions were provided to the                            patient.                           - Resume previous diet.                           - Continue present medications.                           - Repeat colonoscopy is recommended for                            surveillance. The colonoscopy date will be                            determined after pathology results from today's                            exam become available for review. Gatha Mayer, MD 07/30/2019 9:59:15 AM This report has been signed electronically.

## 2019-07-30 NOTE — Progress Notes (Signed)
Report given to PACU, vss 

## 2019-07-30 NOTE — Progress Notes (Signed)
Vs CW I have reviewed the patient's medical history in detail and updated the computerized patient record.   

## 2019-07-30 NOTE — Patient Instructions (Addendum)
6 small polyps removed today.  You also have a condition called diverticulosis - common and not usually a problem. Please read the handout provided.  I will let you know pathology results and when to have another routine colonoscopy by mail and/or My Chart.  I appreciate the opportunity to care for you. Gatha Mayer, MD, FACG    YOU HAD AN ENDOSCOPIC PROCEDURE TODAY AT [facility]:   Refer to the procedure report that was given to you for any specific questions about what was found during the examination.  If the procedure report does not answer your questions, please call your gastroenterologist to clarify.  If you requested that your care partner not be given the details of your procedure findings, then the procedure report has been included in a sealed envelope for you to review at your convenience later.  YOU SHOULD EXPECT: Some feelings of bloating in the abdomen. Passage of more gas than usual.  Walking can help get rid of the air that was put into your GI tract during the procedure and reduce the bloating. If you had a lower endoscopy (such as a colonoscopy or flexible sigmoidoscopy) you may notice spotting of blood in your stool or on the toilet paper. If you underwent a bowel prep for your procedure, you may not have a normal bowel movement for a few days.  Please Note:  You might notice some irritation and congestion in your nose or some drainage.  This is from the oxygen used during your procedure.  There is no need for concern and it should clear up in a day or so.  SYMPTOMS TO REPORT IMMEDIATELY:   Following lower endoscopy (colonoscopy or flexible sigmoidoscopy):  Excessive amounts of blood in the stool  Significant tenderness or worsening of abdominal pains  Swelling of the abdomen that is new, acute  Fever of 100F or higher    For urgent or emergent issues, a gastroenterologist can be reached at any hour by calling [phone]. Do not use MyChart  messaging for urgent concerns.    DIET:  We do recommend a small meal at first, but then you may proceed to your regular diet.  Drink plenty of fluids but you should avoid alcoholic beverages for 24 hours.  ACTIVITY:  You should plan to take it easy for the rest of today and you should NOT DRIVE or use heavy machinery until tomorrow (because of the sedation medicines used during the test).    FOLLOW UP: Our staff will call the number listed on your records 48-72 hours following your procedure to check on you and address any questions or concerns that you may have regarding the information given to you following your procedure. If we do not reach you, we will leave a message.  We will attempt to reach you two times.  During this call, we will ask if you have developed any symptoms of COVID 19. If you develop any symptoms (ie: fever, flu-like symptoms, shortness of breath, cough etc.) before then, please call [phone].  If you test positive for Covid 19 in the 2 weeks post procedure, please call and report this information to [place].    If any biopsies were taken you will be contacted by phone or by letter within the next 1-3 weeks.  Please call us at [phone] if you have not heard about the biopsies in 3 weeks.    SIGNATURES/CONFIDENTIALITY: You and/or your care partner have signed paperwork which will be  entered into your electronic medical record.  These signatures attest to the fact that that the information above on your After Visit Summary has been reviewed and is understood.  Full responsibility of the confidentiality of this discharge information lies with you and/or your care-partner.   Resume medications. Information given on polyps and diverticulosis.

## 2019-07-30 NOTE — Progress Notes (Signed)
Called to room to assist during endoscopic procedure.  Patient ID and intended procedure confirmed with present staff. Received instructions for my participation in the procedure from the performing physician.  

## 2019-08-01 ENCOUNTER — Telehealth: Payer: Self-pay | Admitting: *Deleted

## 2019-08-01 NOTE — Telephone Encounter (Signed)
°  Follow up Call-  Call back number 07/30/2019  Post procedure Call Back phone  # [phone]  Permission to leave phone message Yes  Some recent data might be hidden     Patient questions:  Do you have a fever, pain , or abdominal swelling? No. Pain Score  0 *  Have you tolerated food without any problems? Yes.    Have you been able to return to your normal activities? Yes.    Do you have any questions about your discharge instructions: Diet   No. Medications  No. Follow up visit  No.  Do you have questions or concerns about your Care? No.  Actions: * If pain score is 4 or above: No action needed, pain <4.  1. Have you developed a fever since your procedure? no  2.   Have you had an respiratory symptoms (SOB or cough) since your procedure? no  3.   Have you tested positive for COVID 19 since your procedure no  4.   Have you had any family members/close contacts diagnosed with the COVID 19 since your procedure?  no   If yes to any of these questions please route to Joylene John, RN and Erenest Rasher, RN

## 2019-08-04 ENCOUNTER — Encounter: Payer: Self-pay | Admitting: Internal Medicine

## 2019-12-02 ENCOUNTER — Other Ambulatory Visit: Payer: Self-pay

## 2019-12-02 ENCOUNTER — Ambulatory Visit (INDEPENDENT_AMBULATORY_CARE_PROVIDER_SITE_OTHER): Payer: BC Managed Care – PPO | Admitting: Otolaryngology

## 2019-12-02 ENCOUNTER — Encounter (INDEPENDENT_AMBULATORY_CARE_PROVIDER_SITE_OTHER): Payer: Self-pay | Admitting: Otolaryngology

## 2019-12-02 VITALS — Temp 97.7°F

## 2019-12-02 DIAGNOSIS — H903 Sensorineural hearing loss, bilateral: Secondary | ICD-10-CM

## 2019-12-02 NOTE — Progress Notes (Signed)
HPI: Joseph Mccoy is a 64 y.o. male who presents is referred by [facility] audiology for evaluation of left ear pain and hearing loss.  Patient was having trouble with his hearing at work and went to [employer] to get a hearing test performed.  Apparently he jumped while they were doing the hearing test and has some pain in the left ear.  He has no pain in the ear today.  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  He has good hearing up to 1000 frequency but has moderate to severe hearing loss above 2000 frequency..  Past Medical History:  Diagnosis Date  . Anaphylactic reaction    Meat - alpha gal  . Gastrointestinal hemorrhage after colonoscopic polypectomy   . Hx of adenomatous colonic polyps 05/10/2016  . Urinary frequency    Past Surgical History:  Procedure Laterality Date  . ANKLE FRACTURE SURGERY    . COLONOSCOPY  2018   w/bleed and following flex sigmoid  . FLEXIBLE SIGMOIDOSCOPY N/A 05/02/2016   Procedure: FLEXIBLE SIGMOIDOSCOPY;  Surgeon: Milus Banister, MD;  Location: [facility];  Service: Endoscopy;  Laterality: N/A;  . PROSTATE SURGERY    . RHINOPLASTY     Social History   Socioeconomic History  . Marital status: Married    Spouse name: Not on file  . Number of children: Not on file  . Years of education: Not on file  . Highest education level: Not on file  Occupational History    Employer: [employer]  Tobacco Use  . Smoking status: Current Every Day Smoker    Packs/day: 0.50    Years: 16.00    Pack years: 8.00    Types: Cigarettes    Start date: 2005  . Smokeless tobacco: Never Used  Substance and Sexual Activity  . Alcohol use: Yes    Alcohol/week: 16.0 standard drinks    Types: 12 Cans of beer, 4 Shots of liquor per week  . Drug use: No  . Sexual activity: Not on file  Other Topics Concern  . Not on file  Social History Narrative  . Not on file    Social Determinants of Health   Financial Resource Strain:   . Difficulty of Paying Living Expenses: Not on file  Food Insecurity:   . Worried About Charity fundraiser in the Last Year: Not on file  . Ran Out of Food in the Last Year: Not on file  Transportation Needs:   . Lack of Transportation (Medical): Not on file  . Lack of Transportation (Non-Medical): Not on file  Physical Activity:   . Days of Exercise per Week: Not on file  . Minutes of Exercise per Session: Not on file  Stress:   . Feeling of Stress : Not on file  Social Connections:   . Frequency of Communication with Friends and Family: Not on file  . Frequency of Social Gatherings with Friends and Family: Not on file  . Attends Religious Services: Not on file  . Active Member of Clubs or Organizations: Not on file  . Attends Archivist Meetings: Not on file  . Marital Status: Not on file   Family History  Problem Relation Age of Onset  . Colon cancer Neg Hx   . Colon polyps Neg Hx   . Esophageal cancer Neg Hx   . Rectal cancer Neg Hx   . Stomach cancer Neg Hx  Allergies  Allergen Reactions  . Galactose Anaphylaxis  . Ham Anaphylaxis    No ham, no beef, no pork  . Other     Mammalian meat and other environmental allergies   Prior to Admission medications   Medication Sig Start Date End Date Taking? Authorizing Provider  doxazosin (CARDURA) 2 MG tablet  10/25/18  Yes [provider]  EPINEPHrine (EPIPEN 2-PAK) 0.3 mg/0.3 mL IJ SOAJ injection Inject 0.3 mLs (0.3 mg total) into the muscle once. Repeat in 5 minutes if necessary 11/09/13  Yes Baker, Freeman Caldron, PA-C  EPINEPHrine 0.3 mg/0.3 mL IJ SOAJ injection Inject 0.3 mg into the muscle daily as needed (allerci reaction).    Yes [provider]  fexofenadine (ALLEGRA) 60 MG tablet Take by mouth.  07/08/18  Yes [provider]  methocarbamol (ROBAXIN) 500 MG tablet Take 500 mg by mouth 3 (three) times daily.  11/04/18  Yes  [provider]  tamsulosin (FLOMAX) 0.4 MG CAPS capsule Take 0.4 mg by mouth daily. 07/04/19  Yes [provider]  tolterodine (DETROL LA) 2 MG 24 hr capsule Take 2 mg by mouth daily. 05/19/19  Yes [provider]     Positive ROS: Otherwise negative  All other systems have been reviewed and were otherwise negative with the exception of those mentioned in the HPI and as above.  Physical Exam: Constitutional: Alert, well-appearing, no acute distress Ears: External ears without lesions or tenderness.  He has a little bit of wax in the right ear canal that was removed.  Ear canals and TMs are otherwise clear.  No ear infection noted.  TMs are clear bilaterally with good mobility pneumatic otoscopy. Nasal: External nose without lesions.. Clear nasal passages Oral: Lips and gums without lesions. Tongue and palate mucosa without lesions. Posterior oropharynx clear. Neck: No palpable adenopathy or masses Respiratory: Breathing comfortably  Skin: No facial/neck lesions or rash noted.  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  Plan: He is having no ear pain today.  He complains mostly of hearing problems and his options. I discussed with him that the only treatment option for his hearing loss will be hearing aids.   Radene Journey, MD   CC:

## 2020-03-25 ENCOUNTER — Ambulatory Visit: Payer: BC Managed Care – PPO | Admitting: Cardiovascular Disease

## 2020-03-26 ENCOUNTER — Ambulatory Visit: Payer: BC Managed Care – PPO | Admitting: Cardiovascular Disease

## 2020-04-07 ENCOUNTER — Other Ambulatory Visit: Payer: Self-pay | Admitting: *Deleted

## 2020-04-07 DIAGNOSIS — Z87891 Personal history of nicotine dependence: Secondary | ICD-10-CM

## 2020-04-07 DIAGNOSIS — F17210 Nicotine dependence, cigarettes, uncomplicated: Secondary | ICD-10-CM

## 2020-04-23 ENCOUNTER — Encounter: Payer: Self-pay | Admitting: *Deleted

## 2020-05-12 ENCOUNTER — Other Ambulatory Visit: Payer: Self-pay

## 2020-05-12 ENCOUNTER — Ambulatory Visit
Admission: RE | Admit: 2020-05-12 | Discharge: 2020-05-12 | Disposition: A | Discharge status: Home / Self Care | Payer: BC Managed Care – PPO | Source: Ambulatory Visit | Attending: Acute Care | Admitting: Acute Care

## 2020-05-12 ENCOUNTER — Encounter: Payer: Self-pay | Admitting: Acute Care

## 2020-05-12 ENCOUNTER — Ambulatory Visit (INDEPENDENT_AMBULATORY_CARE_PROVIDER_SITE_OTHER): Payer: BC Managed Care – PPO | Admitting: Acute Care

## 2020-05-12 VITALS — BP 120/70 | HR 82 | Temp 98.3°F | Ht 70.0 in | Wt 162.2 lb

## 2020-05-12 DIAGNOSIS — F17210 Nicotine dependence, cigarettes, uncomplicated: Secondary | ICD-10-CM

## 2020-05-12 DIAGNOSIS — Z122 Encounter for screening for malignant neoplasm of respiratory organs: Secondary | ICD-10-CM

## 2020-05-12 DIAGNOSIS — Z87891 Personal history of nicotine dependence: Secondary | ICD-10-CM

## 2020-05-12 NOTE — Progress Notes (Signed)
Shared Decision Making Visit Lung Cancer Screening Program [phone])   Eligibility:  Age 65 y.o.  Pack Years Smoking History Calculation 34 pack year smoking history (# packs/per year x # years smoked)  Recent History of coughing up blood  no  Unexplained weight loss? no ( >Than 15 pounds within the last 6 months )  Prior History Lung / other cancer no (Diagnosis within the last 5 years already requiring surveillance chest CT Scans).  Smoking Status Current Smoker  Former Smokers: Years since quit: NA  Quit Date: NA  Visit Components:  Discussion included one or more decision making aids. yes  Discussion included risk/benefits of screening. yes  Discussion included potential follow up diagnostic testing for abnormal scans. yes  Discussion included meaning and risk of over diagnosis. yes  Discussion included meaning and risk of False Positives. yes  Discussion included meaning of total radiation exposure. yes  Counseling Included:  Importance of adherence to annual lung cancer LDCT screening. yes  Impact of comorbidities on ability to participate in the program. yes  Ability and willingness to under diagnostic treatment. yes  Smoking Cessation Counseling:  Current Smokers:   Discussed importance of smoking cessation. yes  Information about tobacco cessation classes and interventions provided to patient. yes  Patient provided with "ticket" for LDCT Scan. yes Symptomatic Patient. No  Counseling NA  Diagnosis Code: Tobacco Use Z72.0  Asymptomatic Patient yes  Counseling (Intermediate counseling: > three minutes counseling) [id number]  Former Smokers:   Discussed the importance of maintaining cigarette abstinence. yes  Diagnosis Code: Personal History of Nicotine Dependence. O87.867  Information about tobacco cessation classes and interventions provided to patient. Yes  Patient provided with "ticket" for LDCT Scan. yes  Written Order for Lung Cancer  Screening with LDCT placed in Epic. Yes (CT Chest Lung Cancer Screening Low Dose W/O CM) [id number] Z12.2-Screening of respiratory organs Z87.891-Personal history of nicotine dependence  BP 120/70 (BP Location: Left Arm, Patient Position: Sitting, Cuff Size: Normal)   Pulse 82   Temp 98.3 F (36.8 C) (Temporal)   Ht 5\' 10"  (1.778 m)   Wt 162 lb 3.2 oz (73.6 kg)   SpO2 99%   BMI 23.27 kg/m    I have spent 25 minutes of face to face time with Joseph Mccoy discussing the risks and benefits of lung cancer screening. We viewed a power point together that explained in detail the above noted topics. We paused at intervals to allow for questions to be asked and answered to ensure understanding.We discussed that the single most powerful action that he can take to decrease his risk of developing lung cancer is to quit smoking. We discussed whether or not he is ready to commit to setting a quit date. We discussed options for tools to aid in quitting smoking including nicotine replacement therapy, non-nicotine medications, support groups, Quit Smart classes, and behavior modification. We discussed that often times setting smaller, more achievable goals, such as eliminating 1 cigarette a day for a week and then 2 cigarettes a day for a week can be helpful in slowly decreasing the number of cigarettes smoked. This allows for a sense of accomplishment as well as providing a clinical benefit. I gave him the " Be Stronger Than Your Excuses" card with contact information for community resources, classes, free nicotine replacement therapy, and access to mobile apps, text messaging, and on-line smoking cessation help. I have also given him my card and contact information in the event he needs to contact me.  We discussed the time and location of the scan, and that either Doroteo Glassman RN or I will call with the results within 24-48 hours of receiving them. I have offered him  a copy of the power point we viewed  as a  resource in the event they need reinforcement of the concepts we discussed today in the office. The patient verbalized understanding of all of  the above and had no further questions upon leaving the office. They have my contact information in the event they have any further questions.  I spent 4 minutes counseling on smoking cessation and the health risks of continued tobacco abuse.  I explained to the patient that there has been a high incidence of coronary artery disease noted on these exams. I explained that this is a non-gated exam therefore degree or severity cannot be determined. This patient is not currently on statin therapy. I have asked the patient to follow-up with their PCP regarding any incidental finding of coronary artery disease and management with diet or medication as their PCP  feels is clinically indicated. The patient verbalized understanding of the above and had no further questions upon completion of the visit.       Magdalen Spatz, NP 05/12/2020

## 2020-05-12 NOTE — Patient Instructions (Addendum)
Thank you for participating in the [facility]. It was our pleasure to meet you today. We will call you with the results of your scan within the next few days. Your scan will be assigned a Lung RADS category score by the physicians reading the scans.  This Lung RADS score determines follow up scanning.  See below for description of categories, and follow up screening recommendations. We will be in touch to schedule your follow up screening annually or based on recommendations of our providers. We will fax a copy of your scan results to your Primary Care Physician, or the physician who referred you to the program, to ensure they have the results. Please call the office if you have any questions or concerns regarding your scanning experience or results.  Our office number is [phone]. Please speak with Denise Phelps, RN. She is our Lung Cancer Screening RN. If she is unavailable when you call, please have the office staff send her a message. She will return your call at her earliest convenience. Remember, if your scan is normal, we will scan you annually as long as you continue to meet the criteria for the program. (Age 65-77, Current smoker or smoker who has quit within the last 15 years). If you are a smoker, remember, quitting is the single most powerful action that you can take to decrease your risk of lung cancer and other pulmonary, breathing related problems. We know quitting is hard, and we are here to help.  Please let us know if there is anything we can do to help you meet your goal of quitting. If you are a former smoker, congratulations. We are proud of you! Remain smoke free! Remember you can refer friends or family members through the number above.  We will screen them to make sure they meet criteria for the program. Thank you for helping us take better care of you by participating in Lung Screening.  Lung RADS Categories:  Lung RADS 1: no nodules  or definitely non-concerning nodules.  Recommendation is for a repeat annual scan in 12 months.  Lung RADS 2:  nodules that are non-concerning in appearance and behavior with a very low likelihood of becoming an active cancer. Recommendation is for a repeat annual scan in 12 months.  Lung RADS 3: nodules that are probably non-concerning , includes nodules with a low likelihood of becoming an active cancer.  Recommendation is for a 6-month repeat screening scan. Often noted after an upper respiratory illness. We will be in touch to make sure you have no questions, and to schedule your 6-month scan.  Lung RADS 4 A: nodules with concerning findings, recommendation is most often for a follow up scan in 3 months or additional testing based on our provider's assessment of the scan. We will be in touch to make sure you have no questions and to schedule the recommended 3 month follow up scan.  Lung RADS 4 B:  indicates findings that are concerning. We will be in touch with you to schedule additional diagnostic testing based on our provider's  assessment of the scan.   

## 2020-05-19 ENCOUNTER — Encounter: Payer: Self-pay | Admitting: Acute Care

## 2020-05-24 NOTE — Progress Notes (Signed)
Please call patient and let them  know their  low dose Ct was read as a Lung RADS 2: nodules that are benign in appearance and behavior with a very low likelihood of becoming a clinically active cancer due to size or lack of growth. Recommendation per radiology is for a repeat LDCT in 12 months. Please let them  know we will order and schedule their  annual screening scan for 04/2021. Please let them  know there was notation of CAD on their  scan.  Please remind the patient  that this is a non-gated exam therefore degree or severity of disease  cannot be determined. Please have them  follow up with their PCP regarding potential risk factor modification, dietary therapy or pharmacologic therapy if clinically indicated. Pt.  is not  currently on statin therapy. Please place order for annual  screening scan for  04/2021 and fax results to PCP. Thanks so much. 

## 2020-05-27 ENCOUNTER — Other Ambulatory Visit: Payer: Self-pay | Admitting: *Deleted

## 2020-05-27 DIAGNOSIS — F17210 Nicotine dependence, cigarettes, uncomplicated: Secondary | ICD-10-CM

## 2020-05-27 DIAGNOSIS — Z87891 Personal history of nicotine dependence: Secondary | ICD-10-CM

## 2020-10-05 ENCOUNTER — Emergency Department (HOSPITAL_BASED_OUTPATIENT_CLINIC_OR_DEPARTMENT_OTHER): Payer: BC Managed Care – PPO

## 2020-10-05 ENCOUNTER — Encounter (HOSPITAL_BASED_OUTPATIENT_CLINIC_OR_DEPARTMENT_OTHER): Payer: Self-pay | Admitting: Emergency Medicine

## 2020-10-05 ENCOUNTER — Other Ambulatory Visit: Payer: Self-pay

## 2020-10-05 ENCOUNTER — Emergency Department (HOSPITAL_BASED_OUTPATIENT_CLINIC_OR_DEPARTMENT_OTHER)
Admission: EM | Admit: 2020-10-05 | Discharge: 2020-10-05 | Disposition: A | Discharge status: Home / Self Care | Payer: BC Managed Care – PPO | Attending: Emergency Medicine | Admitting: Emergency Medicine

## 2020-10-05 DIAGNOSIS — F17210 Nicotine dependence, cigarettes, uncomplicated: Secondary | ICD-10-CM | POA: Insufficient documentation

## 2020-10-05 DIAGNOSIS — R0789 Other chest pain: Secondary | ICD-10-CM | POA: Diagnosis present

## 2020-10-05 DIAGNOSIS — R072 Precordial pain: Secondary | ICD-10-CM | POA: Diagnosis not present

## 2020-10-05 LAB — CBC
HCT: 46.1 % (ref 39.0–52.0)
Hemoglobin: 16 g/dL (ref 13.0–17.0)
MCH: 36.3 pg — ABNORMAL HIGH (ref 26.0–34.0)
MCHC: 34.7 g/dL (ref 30.0–36.0)
MCV: 104.5 fL — ABNORMAL HIGH (ref 80.0–100.0)
Platelets: 265 10*3/uL (ref 150–400)
RBC: 4.41 MIL/uL (ref 4.22–5.81)
RDW: 12.9 % (ref 11.5–15.5)
WBC: 8 10*3/uL (ref 4.0–10.5)
nRBC: 0 % (ref 0.0–0.2)

## 2020-10-05 LAB — BASIC METABOLIC PANEL
Anion gap: 10 (ref 5–15)
BUN: 10 mg/dL (ref 8–23)
CO2: 28 mmol/L (ref 22–32)
Calcium: 9.7 mg/dL (ref 8.9–10.3)
Chloride: 101 mmol/L (ref 98–111)
Creatinine, Ser: 0.68 mg/dL (ref 0.61–1.24)
GFR, Estimated: >60 mL/min (ref >60)
Glucose, Bld: 98 mg/dL (ref 70–99)
Potassium: 4.4 mmol/L (ref 3.5–5.1)
Sodium: 139 mmol/L (ref 135–145)

## 2020-10-05 LAB — TROPONIN I (HIGH SENSITIVITY)
Troponin I (High Sensitivity): 3 ng/L (ref <18)
Troponin I (High Sensitivity): 3 ng/L (ref <18)

## 2020-10-05 MED ORDER — METHOCARBAMOL 500 MG PO TABS: 500.0000 mg | ORAL_TABLET | Freq: Three times a day (TID) | ORAL | 0 refills | Status: DC | PRN

## 2020-10-05 NOTE — Discharge Instructions (Addendum)
You came to the emergency department today to be evaluated for your chest pain.  Your physical exam, EKG, lab work, and chest x-ray were reassuring.  I have given you prescription for Robaxin to help with your pain.  Please take this medication as prescribed.  Please follow-up closely with your primary care provider.  Today you were prescribed Methocarbamol (Robaxin).  Methocarbamol (Robaxin) is used to treat muscle spasms/pain.  It works by helping to relax the muscles.  Drowsiness, dizziness, lightheadedness, stomach upset, nausea/vomiting, or blurred vision may occur.  Do not drive, use machinery, or do anything that needs alertness or clear vision until you can do it safely.  Do not combine this medication with alcoholic beverages, marijuana, or other central nervous system depressants.    Get help right away if: Your chest pain gets worse. You have a cough that gets worse, or you cough up blood. You have severe pain in your abdomen. You faint. You have sudden, unexplained chest discomfort. You have sudden, unexplained discomfort in your arms, back, neck, or jaw. You have shortness of breath at any time. You suddenly start to sweat, or your skin gets clammy. You feel nausea or you vomit. You suddenly feel lightheaded or dizzy. You have severe weakness, or unexplained weakness or fatigue. Your heart begins to beat quickly, or it feels like it is skipping beats.

## 2020-10-05 NOTE — ED Provider Notes (Signed)
[facility] Provider Note   CSN: [id number] Arrival date & time: 10/05/20  0955     History Chief Complaint  Patient presents with   Chest Pain    Joseph Mccoy is a 65 y.o. male with medical history as noted below.  Presents with chief complaint of chest pain.  Pain started yesterday morning and has been constant since then.  Patient reports that he woke this morning at 0 300 with pain.  Pain is located to left side of his chest.  Patient reports that pain radiates to his back.  Patient describes pain as "something sticking in me."  Patient rates pain 8/10 on the pain scale.  Pain is worse when he moves from a sitting to standing position and laying to sitting position.  No worsening of pain with ambulation.  Patient has not tried any modalities to alleviate his symptoms.  Patient denies any associated nausea, vomiting, diaphoresis, or shortness of breath.  Patient reports that last week he did a lot of heavy lifting.   Chest Pain Associated symptoms: back pain   Associated symptoms: no abdominal pain, no dizziness, no fever, no headache, no nausea, no palpitations, no shortness of breath and no vomiting       Past Medical History:  Diagnosis Date   Anaphylactic reaction    Meat - alpha gal   Gastrointestinal hemorrhage after colonoscopic polypectomy    Hx of adenomatous colonic polyps 05/10/2016   Urinary frequency     Patient Active Problem List   Diagnosis Date Noted   Hx of adenomatous colonic polyps 05/10/2016    Past Surgical History:  Procedure Laterality Date   ANKLE FRACTURE SURGERY     COLONOSCOPY  2018   w/bleed and following flex sigmoid   FLEXIBLE SIGMOIDOSCOPY N/A 05/02/2016   Procedure: FLEXIBLE SIGMOIDOSCOPY;  Surgeon: Milus Banister, MD;  Location: [place];  Service: Endoscopy;  Laterality: N/A;   PROSTATE SURGERY     RHINOPLASTY         Family History  Problem Relation Age of Onset   Colon cancer Neg Hx     Colon polyps Neg Hx    Esophageal cancer Neg Hx    Rectal cancer Neg Hx    Stomach cancer Neg Hx     Social History   Tobacco Use   Smoking status: Every Day    Packs/day: 0.50    Years: 16.00    Pack years: 8.00    Types: Cigarettes    Start date: 2005   Smokeless tobacco: Never   Tobacco comments:    smokes a pack per day as of 05/12/20  Substance Use Topics   Alcohol use: Yes    Alcohol/week: 16.0 standard drinks    Types: 12 Cans of beer, 4 Shots of liquor per week   Drug use: No    Home Medications Prior to Admission medications   Medication Sig Start Date End Date Taking? Authorizing Provider  EPINEPHrine (EPIPEN 2-PAK) 0.3 mg/0.3 mL IJ SOAJ injection Inject 0.3 mLs (0.3 mg total) into the muscle once. Repeat in 5 minutes if necessary 11/09/13   Liam Graham, PA-C  tamsulosin (FLOMAX) 0.4 MG CAPS capsule Take 0.4 mg by mouth daily. 07/04/19   [provider]  tolterodine (DETROL LA) 2 MG 24 hr capsule Take 2 mg by mouth daily. 05/19/19   [provider]    Allergies    Galactose, Ham, and Other  Review of Systems   Review  of Systems  Constitutional:  Negative for chills and fever.  Eyes:  Negative for visual disturbance.  Respiratory:  Negative for shortness of breath.   Cardiovascular:  Positive for chest pain. Negative for palpitations and leg swelling.  Gastrointestinal:  Negative for abdominal pain, nausea and vomiting.  Musculoskeletal:  Positive for back pain. Negative for neck pain.  Skin:  Negative for color change and rash.  Allergic/Immunologic: Negative for immunocompromised state.  Neurological:  Negative for dizziness, syncope, light-headedness and headaches.  Psychiatric/Behavioral:  Negative for confusion.    Physical Exam Updated Vital Signs BP (!) 138/93   Pulse 84   Temp 98.7 F (37.1 C) (Oral)   Resp 20   Ht '5\' 10"'$  (1.778 m)   Wt 78 kg   SpO2 97%   BMI 24.68 kg/m   Physical Exam Vitals and nursing note  reviewed.  Constitutional:      General: He is not in acute distress.    Appearance: He is not ill-appearing, toxic-appearing or diaphoretic.  HENT:     Head: Normocephalic.  Eyes:     General: No scleral icterus.       Right eye: No discharge.        Left eye: No discharge.  Cardiovascular:     Rate and Rhythm: Normal rate.     Pulses:          Carotid pulses are 2+ on the right side and 2+ on the left side.      Radial pulses are 2+ on the right side and 2+ on the left side.  Pulmonary:     Effort: Pulmonary effort is normal. No tachypnea or bradypnea.     Breath sounds: Normal breath sounds. No stridor.  Chest:     Chest wall: No mass, lacerations, deformity, swelling, tenderness, crepitus or edema.  Abdominal:     General: There is no distension. There are no signs of injury.     Palpations: Abdomen is soft. There is no mass or pulsatile mass.     Tenderness: There is no abdominal tenderness. There is no guarding or rebound.     Hernia: There is no hernia in the umbilical area or ventral area.  Musculoskeletal:     Right lower leg: Normal.     Left lower leg: Normal.  Skin:    General: Skin is warm and dry.     Coloration: Skin is not cyanotic or pale.  Neurological:     General: No focal deficit present.     Mental Status: He is alert.     GCS: GCS eye subscore is 4. GCS verbal subscore is 5. GCS motor subscore is 6.  Psychiatric:        Behavior: Behavior is cooperative.    ED Results / Procedures / Treatments   Labs (all labs ordered are listed, but only abnormal results are displayed) Labs Reviewed  CBC - Abnormal; Notable for the following components:      Result Value   MCV 104.5 (*)    MCH 36.3 (*)    All other components within normal limits  BASIC METABOLIC PANEL  TROPONIN I (HIGH SENSITIVITY)  TROPONIN I (HIGH SENSITIVITY)    EKG EKG Interpretation  Date/Time:  Tuesday October 05 2020 10:04:14 EDT Ventricular Rate:  82 PR Interval:  140 QRS  Duration: 94 QT Interval:  388 QTC Calculation: 453 R Axis:   75 Text Interpretation: Normal sinus rhythm Normal ECG NSR, similar to previous Confirmed by Lavenia Atlas (  [id number]) on 10/05/2020 1:24:27 PM  Radiology DG Chest 2 View  Result Date: 10/05/2020 CLINICAL DATA:  Chest pain. EXAM: CHEST - 2 VIEW COMPARISON:  May 12, 2020. FINDINGS: The heart size and mediastinal contours are within normal limits. Both lungs are clear. The visualized skeletal structures are unremarkable. IMPRESSION: No active cardiopulmonary disease. Electronically Signed   By: Marijo Conception M.D.   On: 10/05/2020 10:41    Procedures Procedures   Medications Ordered in ED Medications - No data to display  ED Course  I have reviewed the triage vital signs and the nursing notes.  Pertinent labs & imaging results that were available during my care of the patient were reviewed by me and considered in my medical decision making (see chart for details).    MDM Rules/Calculators/A&P                           Alert 65 year old male in no acute distress, nontoxic appearing.  Presents with chief complaint of chest pain.  Pain started yesterday morning.  ACS work-up started while patient was in triage Chest x-ray shows no active cardiopulmonary disease EKG shows sinus rhythm Troponin 3 and 3 with delta of 0.   Heart score 3 Will suspicion for ACS at this time Low suspicion for PE as patient is low risk based on Wells PE score.  Pain is reproducible when changing positions.  Suspect that patient's pain is musculoskeletal in nature.  Will have patient use over-the-counter pain medication as needed.  Patient prescribed short course of Robaxin.  Patient to follow-up closely with primary care provider.  Discussed results, findings, treatment and follow up. Patient advised of return precautions. Patient verbalized understanding and agreed with plan.  Patient care and treatment were discussed with attending  physician   Final Clinical Impression(s) / ED Diagnoses Final diagnoses:  Precordial chest pain    Rx / DC Orders ED Discharge Orders          Ordered    methocarbamol (ROBAXIN) 500 MG tablet  Every 8 hours PRN        10/05/20 1513             Loni Beckwith, PA-C 10/05/20 2150    Horton, Alvin Critchley, DO 10/07/20 1157

## 2020-10-05 NOTE — ED Triage Notes (Signed)
Intermittent left chest pain radiating to back, worse with motion and lifting per pt. Was see at [facility] , ECG done , sent to Ed for further evaluation.

## 2020-12-13 ENCOUNTER — Telehealth: Payer: Self-pay

## 2020-12-13 NOTE — Telephone Encounter (Signed)
Pt called and would like to set up an apt with Dr. Ernestina Patches

## 2020-12-20 ENCOUNTER — Other Ambulatory Visit: Payer: Self-pay

## 2020-12-20 ENCOUNTER — Encounter: Payer: Self-pay | Admitting: Physical Medicine and Rehabilitation

## 2020-12-20 ENCOUNTER — Ambulatory Visit (INDEPENDENT_AMBULATORY_CARE_PROVIDER_SITE_OTHER): Payer: BC Managed Care – PPO | Admitting: Physical Medicine and Rehabilitation

## 2020-12-20 VITALS — BP 128/85 | HR 69

## 2020-12-20 DIAGNOSIS — M545 Low back pain, unspecified: Secondary | ICD-10-CM | POA: Diagnosis not present

## 2020-12-20 DIAGNOSIS — M25551 Pain in right hip: Secondary | ICD-10-CM | POA: Diagnosis not present

## 2020-12-20 DIAGNOSIS — G8929 Other chronic pain: Secondary | ICD-10-CM | POA: Diagnosis not present

## 2020-12-20 DIAGNOSIS — M7918 Myalgia, other site: Secondary | ICD-10-CM | POA: Diagnosis not present

## 2020-12-20 MED ORDER — DIAZEPAM 5 MG PO TABS: ORAL_TABLET | ORAL | 0 refills | Status: DC

## 2020-12-20 NOTE — Progress Notes (Signed)
Pt state lower back pain. Pt state bending and walking makes the pain worse. Pt state he takes pain meds and uses pain cream to help ease his pain.  Numeric Pain Rating Scale and Functional Assessment Average Pain 10 Pain Right Now 6 My pain is intermittent, sharp, dull, stabbing, and aching Pain is worse with: walking, bending, and some activites Pain improves with: heat/ice and medication   In the last MONTH (on 0-10 scale) has pain interfered with the following?  1. General activity like being  able to carry out your everyday physical activities such as walking, climbing stairs, carrying groceries, or moving a chair?  Rating(7)  2. Relation with others like being able to carry out your usual social activities and roles such as  activities at home, at work and in your community. Rating(8)  3. Enjoyment of life such that you have  been bothered by emotional problems such as feeling anxious, depressed or irritable?  Rating(9)

## 2020-12-20 NOTE — Progress Notes (Signed)
Joseph Mccoy - 65 y.o. male MRN [id number]  Date of birth: 04/27/1955  Office Visit Note: Visit Date: 12/20/2020 PCP: Leanna Battles, MD Referred by: Leanna Battles, MD  Subjective: Chief Complaint  Patient presents with   Lower Back - Pain   HPI: Joseph Mccoy is a 65 y.o. male who comes in today for evaluation of chronic, worsening and severe bilateral lower back pain, left greater than right.  Patient also reports intermittent referral of pain to right anterior thigh/groin area.  Patient reports pain is exacerbated by activity, bending over and prolonged standing.  Patient describes pain as a soreness sensation, currently rates a 7 out of 10.  Patient reports some relief of pain with sitting, use of Tylenol and topical pain medications as needed. Patient also reports some relief of pain with home exercises. Patient has not undergone formal physical therapy to his knowledge. At this time patient has not had formal imaging of his lumbar spine. He has had CT scan of the thoracic spine and MRI of the abdomen. Patient works for [employer] as Therapist, sports and reports he is standing and moving frequently during the day, which seems to increase his pain. Patient states he is unable to take frequent breaks at work due to increased workload. Patient was seen in the Emergency Department in September due to chest and lower back pain. Patient was given Robaxin to take at home, but reports did forgot to take medication. Patient did have paraspinal trigger point injections performed by Dr. Magnus Sinning in 2020, patient states he thinks these injections did help alleviate some of his pain. Patient denies focal weakness, numbness and tingling. Patient denies recent trauma or falls.   Review of Systems  Musculoskeletal:  Positive for back pain.  Neurological:  Negative for tingling, sensory change, focal weakness and weakness.  All other systems reviewed and are negative. Otherwise  per HPI.  Assessment & Plan: Visit Diagnoses:    ICD-10-CM   1. Myofascial pain syndrome  M79.18 Ambulatory referral to Physical Therapy    2. Chronic bilateral low back pain without sciatica  M54.50 MR LUMBAR SPINE WO CONTRAST   G89.29 Ambulatory referral to Physical Therapy    3. Pain in right hip  M25.551 Ambulatory referral to Physical Therapy       Plan: Findings:  Chronic, worsening and severe bilateral lower back pain, left greater than right. There also seems to be continued intermittent referral of pain to right anterior thigh/groin region. Patient continues to have excruciating pain despite good conservative therapies such as home exercise regimen, rest and use of Tylenol and topical pain medications as needed. Patient's clinical presentation and exam are consistent with myofascial pain. He does have tenderness upon palpation to bilateral paraspinal regions of the lumbar spine, specifically on left side. We believe the next step is to place a referral for formal physical therapy with our in-house team, which we did complete today. We feel that patient could significantly benefit from manual treatments and dry needling. We also placed an order for lumbar MRI imaging today as he has not had formal lumbar spine imaging in the past. We encouraged patient to take frequent breaks at work if possible and to continue with home exercise regimen. We will follow-up with patient after lumbar MRI is obtained for review and to discuss possible treatment options and to re-evaluate pain after several sessions of physical therapy. We would consider performing lumbar epidural steroid injection if warranted. No red flag symptoms noted  upon exam today.    Meds & Orders:  Meds ordered this encounter  Medications   diazepam (VALIUM) 5 MG tablet    Sig: Take one tablet by mouth with food one hour prior to procedure. May repeat 30 minutes prior if needed.    Dispense:  2 tablet    Refill:  0    Order  Specific Question:   Supervising Provider    Answer:   Magnus Sinning [[id number]]     Orders Placed This Encounter  Procedures   MR LUMBAR SPINE WO CONTRAST   Ambulatory referral to Physical Therapy    Follow-up: Return for Follow-up when lumbar MRI is complete for review.   Procedures: No procedures performed      Clinical History: No specialty comments available.   He reports that he has been smoking cigarettes. He started smoking about 17 years ago. He has a 8.00 pack-year smoking history. He has never used smokeless tobacco. No results for input(s): HGBA1C, LABURIC in the last 8760 hours.  Objective:  VS:  HT:    WT:   BMI:     BP:128/85  HR:69bpm  TEMP: ( )  RESP:  Physical Exam Vitals and nursing note reviewed.  HENT:     Head: Normocephalic and atraumatic.     Right Ear: External ear normal.     Left Ear: External ear normal.     Nose: Nose normal.     Mouth/Throat:     Mouth: Mucous membranes are moist.  Eyes:     Extraocular Movements: Extraocular movements intact.  Cardiovascular:     Rate and Rhythm: Normal rate.     Pulses: Normal pulses.  Pulmonary:     Effort: Pulmonary effort is normal.  Abdominal:     General: Abdomen is flat. There is no distension.  Musculoskeletal:        General: Tenderness present.     Cervical back: Normal range of motion.     Comments: Pt is slow to rises from seated position to standing without difficulty.  Concordant low back pain with facet loading, lumbar spine extension and rotation. Strong distal strength without clonus, no pain upon palpation of greater trochanters. Sensation intact bilaterally. Walks independently, gait steady. Tenderness noted upon palpation to bilateral paraspinal regions of lumbar spine, left greater than right.  Skin:    General: Skin is warm and dry.     Capillary Refill: Capillary refill takes less than 2 seconds.  Neurological:     General: No focal deficit present.     Mental Status: He is  alert and oriented to person, place, and time.  Psychiatric:        Mood and Affect: Mood normal.    Ortho Exam  Imaging: No results found.  Past Medical/Family/Surgical/Social History: Medications & Allergies reviewed per EMR, new medications updated. Patient Active Problem List   Diagnosis Date Noted   Hx of adenomatous colonic polyps 05/10/2016   Past Medical History:  Diagnosis Date   Anaphylactic reaction    Meat - alpha gal   Gastrointestinal hemorrhage after colonoscopic polypectomy    Hx of adenomatous colonic polyps 05/10/2016   Urinary frequency    Family History  Problem Relation Age of Onset   Colon cancer Neg Hx    Colon polyps Neg Hx    Esophageal cancer Neg Hx    Rectal cancer Neg Hx    Stomach cancer Neg Hx    Past Surgical History:  Procedure Laterality Date  ANKLE FRACTURE SURGERY     COLONOSCOPY  2018   w/bleed and following flex sigmoid   FLEXIBLE SIGMOIDOSCOPY N/A 05/02/2016   Procedure: FLEXIBLE SIGMOIDOSCOPY;  Surgeon: Milus Banister, MD;  Location: [place];  Service: Endoscopy;  Laterality: N/A;   PROSTATE SURGERY     RHINOPLASTY     Social History   Occupational History    Employer: [employer]  Tobacco Use   Smoking status: Every Day    Packs/day: 0.50    Years: 16.00    Pack years: 8.00    Types: Cigarettes    Start date: 2005   Smokeless tobacco: Never   Tobacco comments:    smokes a pack per day as of 05/12/20  Substance and Sexual Activity   Alcohol use: Yes    Alcohol/week: 16.0 standard drinks    Types: 12 Cans of beer, 4 Shots of liquor per week   Drug use: No   Sexual activity: Not on file

## 2021-01-17 ENCOUNTER — Inpatient Hospital Stay: Admission: RE | Admit: 2021-01-17 | Payer: BC Managed Care – PPO | Source: Ambulatory Visit

## 2021-01-21 ENCOUNTER — Other Ambulatory Visit: Payer: Self-pay

## 2021-01-21 ENCOUNTER — Ambulatory Visit
Admission: RE | Admit: 2021-01-21 | Discharge: 2021-01-21 | Disposition: A | Discharge status: Home / Self Care | Payer: BC Managed Care – PPO | Source: Ambulatory Visit | Attending: Physical Medicine and Rehabilitation | Admitting: Physical Medicine and Rehabilitation

## 2021-01-25 ENCOUNTER — Ambulatory Visit: Payer: BC Managed Care – PPO | Attending: Physical Medicine and Rehabilitation

## 2021-01-25 NOTE — Therapy (Incomplete)
OUTPATIENT PHYSICAL THERAPY THORACOLUMBAR EVALUATION   Patient Name: Joseph Mccoy MRN: [id number] DOB:[birth year], 66 y.o., male Today's Date: 01/25/2021    Past Medical History:  Diagnosis Date   Anaphylactic reaction    Meat - alpha gal   Gastrointestinal hemorrhage after colonoscopic polypectomy    Hx of adenomatous colonic polyps 05/10/2016   Urinary frequency    Past Surgical History:  Procedure Laterality Date   ANKLE FRACTURE SURGERY     COLONOSCOPY  2018   w/bleed and following flex sigmoid   FLEXIBLE SIGMOIDOSCOPY N/A 05/02/2016   Procedure: FLEXIBLE SIGMOIDOSCOPY;  Surgeon: Milus Banister, MD;  Location: [place];  Service: Endoscopy;  Laterality: N/A;   PROSTATE SURGERY     RHINOPLASTY     Patient Active Problem List   Diagnosis Date Noted   Hx of adenomatous colonic polyps 05/10/2016    PCP: Donnajean Lopes, MD  REFERRING PROVIDER: Lorine Bears, NP  REFERRING DIAG:  Chronic bilateral low back pain without sciatica  Myofascial pain syndrome  Pain in right hip    THERAPY DIAG:    ONSET DATE: ***  SUBJECTIVE:                                                                                                                                                                                           SUBJECTIVE STATEMENT: Pt. reports PERTINENT HISTORY:    PAIN:  Are you having pain? Yes VAS scale: ***/10 Pain location: low back Pain orientation: {Pain Orientation:25161}  PAIN TYPE: {type:313116} Pain description: {PAIN DESCRIPTION:21022940}  Aggravating factors: Prolonged standing and walking Relieving factors: ***  PRECAUTIONS: {Therapy precautions:24002}  WEIGHT BEARING RESTRICTIONS {Yes ***/No:24003}  FALLS:  Has patient fallen in last 6 months? {yes/no:20286}, Number of falls: ***  LIVING ENVIRONMENT: Lives with: {[facility] lives with:25569::"lives with their family"} Lives in: {Lives in:25570} Stairs: {yes/no:20286};  {Stairs:24000} Has following equipment at home: {Assistive devices:23999}  OCCUPATION: ***  PLOF: {PLOF:24004}  PATIENT GOALS ***   OBJECTIVE:   DIAGNOSTIC FINDINGS:  IMPRESSION: 1. Moderate to advanced multilevel lumbar spondylosis, most pronounced at L4-5 and L5-S1. Prominent discogenic reactive endplate change with marrow edema at L4-5 and L5-S1 could contribute to lower back pain. 2. Broad-based left foraminal to extraforaminal disc protrusion at L1-2, contacting and potentially irritating the exiting left L1 nerve root. 3. Right foraminal to extraforaminal disc protrusion at L3-4, contacting and potentially irritating the exiting right L3 nerve root. 4. Mild edema within the left greater than right lower posterior paraspinous musculature, suggesting muscular injury/strain.  PATIENT SURVEYS:  FOTO ***  SCREENING FOR RED FLAGS: Bowel or bladder incontinence: {WNU/UV:253664403} Spinal tumors: {Yes/No:304960894}  Cauda equina syndrome: {Yes/No:304960894} Compression fracture: {Yes/No:304960894} Abdominal aneurysm: {Yes/No:304960894}  COGNITION:  Overall cognitive status: Within functional limits for tasks assessed     SENSATION:  Light touch: {intact/deficits:24005}  MUSCLE LENGTH: Hamstrings: Right *** deg; Left *** deg Marcello Moores test: Right *** deg; Left *** deg  POSTURE:  ***  PALPATION: ***  LUMBARAROM/PROM  A/PROM A/PROM  01/25/2021  Flexion   Extension   Right lateral flexion   Left lateral flexion   Right rotation   Left rotation    (Blank rows = not tested)  LE AROM/PROM:  A/PROM Right 01/25/2021 Left 01/25/2021  Hip flexion    Hip extension    Hip abduction    Hip adduction    Hip internal rotation    Hip external rotation    Knee flexion    Knee extension    Ankle dorsiflexion    Ankle plantarflexion    Ankle inversion    Ankle eversion     (Blank rows = not tested)  LE MMT:  MMT Right 01/25/2021 Left 01/25/2021  Hip flexion     Hip extension    Hip abduction    Hip adduction    Hip internal rotation    Hip external rotation    Knee flexion    Knee extension    Ankle dorsiflexion    Ankle plantarflexion    Ankle inversion    Ankle eversion     (Blank rows = not tested)  LUMBAR SPECIAL TESTS:  Straight leg raise test: {pos/neg:25243}, Slump test: {pos/neg:25243}, and SI Compression/distraction test: {pos/neg:25243}  FUNCTIONAL TESTS:  {Functional tests:24029}  GAIT: Distance walked: *** Assistive device utilized: {Assistive devices:23999} Level of assistance: {Levels of assistance:24026} Comments: ***    TODAY'S TREATMENT  ***   PATIENT EDUCATION:  Education details: Eval findings, POC, HEP, sleeping positions and support for comfort Person educated: Patient Education method: Explanation, Demonstration, Tactile cues, Verbal cues, and Handouts Education comprehension: verbalized understanding, returned demonstration, verbal cues required, and tactile cues required   HOME EXERCISE PROGRAM: ***  ASSESSMENT:  CLINICAL IMPRESSION: Patient is a 66 y.o. M who was seen today for physical therapy evaluation and treatment for ***. Objective impairments include {opptimpairments:25111}. These impairments are limiting patient from {activity limitations:25113}. Personal factors including {Personal factors:25162} are also affecting patient's functional outcome. Patient will benefit from skilled PT to address above impairments and improve overall function.  REHAB POTENTIAL: {rehabpotential:25112}  CLINICAL DECISION MAKING: {clinical decision making:25114}  EVALUATION COMPLEXITY: {Evaluation complexity:25115}   GOALS:  SHORT TERM GOALS:  STG Name Target Date Goal status  1 Pt will be Ind in an initial HEP Baseline: started on eval 02/15/21 INITIAL  2 Pt will be be able tovoice understanding of measures to decrease and manage pain Baseline:  02/15/21 INITIAL  3 *** Baseline: {follow up:25551}  {GOALSTATUS:25110}  4 *** Baseline: {follow up:25551} {GOALSTATUS:25110}  5 *** Baseline: {follow up:25551} {GOALSTATUS:25110}  6 *** Baseline: {follow up:25551} {GOALSTATUS:25110}  7 *** Baseline: {follow up:25551} {GOALSTATUS:25110}   LONG TERM GOALS:   LTG Name Target Date Goal status  1 Pt will be Ind in a final  Baseline: {:PHR,WPLUS4} INITIAL  2 *** Baseline: {follow up:25551} {GOALSTATUS:25110}  3 *** Baseline: {follow up:25551} {GOALSTATUS:25110}  4 *** Baseline: {follow up:25551} {GOALSTATUS:25110}  5 *** Baseline: {follow up:25551} {GOALSTATUS:25110}  6 *** Baseline: {follow up:25551} {GOALSTATUS:25110}  7 *** Baseline: {follow up:25551} {GOALSTATUS:25110}   PLAN: PT FREQUENCY: {rehab frequency:25116}  PT DURATION: {rehab duration:25117}  PLANNED INTERVENTIONS: {rehab planned interventions:25118::"Therapeutic exercises","Therapeutic activity","Neuro Muscular  re-education","Balance training","Gait training","Patient/Family education","Joint mobilization"}  PLAN FOR NEXT SESSION: ***   Gar Ponto MS, PT 01/25/21 12:39 PM

## 2021-01-28 ENCOUNTER — Other Ambulatory Visit: Payer: Self-pay

## 2021-01-28 ENCOUNTER — Ambulatory Visit: Payer: BC Managed Care – PPO | Admitting: Physical Medicine and Rehabilitation

## 2021-01-28 ENCOUNTER — Encounter: Payer: Self-pay | Admitting: Physical Medicine and Rehabilitation

## 2021-01-28 VITALS — BP 130/81 | HR 73

## 2021-01-28 DIAGNOSIS — M5416 Radiculopathy, lumbar region: Secondary | ICD-10-CM

## 2021-01-28 DIAGNOSIS — M5441 Lumbago with sciatica, right side: Secondary | ICD-10-CM

## 2021-01-28 DIAGNOSIS — M5116 Intervertebral disc disorders with radiculopathy, lumbar region: Secondary | ICD-10-CM | POA: Diagnosis not present

## 2021-01-28 DIAGNOSIS — M7918 Myalgia, other site: Secondary | ICD-10-CM | POA: Diagnosis not present

## 2021-01-28 DIAGNOSIS — G8929 Other chronic pain: Secondary | ICD-10-CM

## 2021-01-28 MED ORDER — DIAZEPAM 5 MG PO TABS: ORAL_TABLET | ORAL | 0 refills | Status: AC

## 2021-01-28 NOTE — Progress Notes (Signed)
Franchot Pollitt - 66 y.o. male MRN [id number]  Date of birth: 1955/06/11  Office Visit Note: Visit Date: 01/28/2021 PCP: Donnajean Lopes, MD Referred by: Donnajean Lopes, MD  Subjective: Chief Complaint  Patient presents with   Lower Back - Pain   Neck - Pain   Right Shoulder - Pain   HPI: Genesis Paget is a 65 y.o. male who comes in today for evaluation of right sided lower back pain radiating up his back to neck. Patient also reports referral pattern of intermittent right anterior thigh/groin pain. Patient reports pain is exacerbated by standing, bending over and activity.  Patient reports pain is exacerbated by walking, prolonged standing and activity, currently describes pain as a sharp and sore sensation, rates as 8 out of 10.  Some relief with home exercise regimen, rest, use of Tylenol and topical pain creams.  Patient has not undergone formal physical therapy to his knowledge.  Patient's recent lumbar MRI exhibits multilevel facet hypertrophy, right foraminal to extraforaminal disc protrusion at L3-L4 that is possibly contacting the exiting right L3 nerve.  No high-grade spinal canal stenosis noted.  During our last office visit in November I did discuss formal physical therapy treatments with patient and placed a referral to start PT.  Patient states he was not able to attend his first appointment due to obligations at work, however he plans to call back and reschedule.  Patient states that he works for [employer] as Therapist, sports and is going to speak with his supervisor about rearranging his work schedule so he is able to attend physical therapy.  Patient states he does feel like his muscles are very tight and stiff, which he contributes to long hours at work and prolonged periods of standing on his feet.  Patient did have paraspinal trigger point injections performed by Dr. Magnus Sinning in 2020 and reports that these injections did help to alleviate his  pain.  Patient states that he is a very anxious person and is requesting a medication to help him relax if we decide to move forward with any procedures.  Patient denies focal weakness, numbness and tingling.  Patient denies recent trauma or falls.  Review of Systems  Musculoskeletal:  Positive for back pain.  Neurological:  Negative for tingling, sensory change, focal weakness and weakness.  All other systems reviewed and are negative. Otherwise per HPI.  Assessment & Plan: Visit Diagnoses:    ICD-10-CM   1. Lumbar radiculopathy  M54.16 Ambulatory referral to Physical Medicine Rehab    2. Chronic right-sided low back pain with right-sided sciatica  M54.41    G89.29     3. Intervertebral disc disorders with radiculopathy, lumbar region  M51.16     4. Myofascial pain syndrome  M79.18        Plan: Findings:  Chronic, worsening and severe right-sided lower back pain radiating up his back to neck, there is also a referral pattern of intermittent right anterior thigh/groin pain.  Patient continues to have excruciating pain despite good conservative therapy such as home exercise program, rest and use of medications.  Patient's clinical presentation and exam are consistent with L3 nerve pattern, we also feel that there could be a myofascial component to his pain as he does have diffuse pain radiating up his back to his neck.  We believe the next step is to perform a diagnostic and hopefully therapeutic right L3 transforaminal epidural steroid injection under fluoroscopic guidance.  Patient did voice concerns to  [place] today about anxiety surrounding procedures. We did discuss use of oral sedation pre-procedure to help him relax and placed a prescription for Valium.  Patient instructed to call [facility] to reschedule physical therapy appointment as we do think these treatments will be beneficial for him.  Patient encouraged to remain active and to continue home exercise  regimen as tolerated.  No red flag symptoms noted upon exam today.   Meds & Orders:  Meds ordered this encounter  Medications   diazepam (VALIUM) 5 MG tablet    Sig: Take one tablet by mouth with food one hour prior to procedure. May repeat 30 minutes prior if needed.    Dispense:  2 tablet    Refill:  0    Order Specific Question:   Supervising Provider    Answer:   Magnus Sinning [[id number]]    Orders Placed This Encounter  Procedures   Ambulatory referral to Physical Medicine Rehab    Follow-up: Return for Right L3 transforaminal epidural steroid injection.   Procedures: No procedures performed      Clinical History: EXAM: MRI LUMBAR SPINE WITHOUT CONTRAST   TECHNIQUE: Multiplanar, multisequence MR imaging of the lumbar spine was performed. No intravenous contrast was administered.   COMPARISON:  None available.   FINDINGS: Segmentation: Standard. Lowest well-formed disc space labeled the L5-S1 level.   Alignment: Mild levoscoliosis with straightening of the normal lumbar lordosis. No significant listhesis.   Vertebrae: Vertebral body height maintained without acute or chronic fracture. Bone marrow signal intensity diffusely heterogeneous without worrisome osseous lesion. Scattered discogenic reactive endplate changes present throughout the lumbar spine, most pronounced at the L4-5 and L5-S1 levels where there is associated prominent reactive marrow edema.   Conus medullaris and cauda equina: Conus extends to the L1-2 level. Conus and cauda equina appear normal.   Paraspinal and other soft tissues: Mild edema present within the left greater than right lower posterior paraspinous musculature, suggesting muscular injury/strain (series 8, image 14). Paraspinous soft tissues demonstrate no other acute finding. Subcentimeter simple cyst noted at the visualized lower right kidney. Visualized visceral structures otherwise unremarkable.   Disc levels:   L1-2:  Degenerative intervertebral disc space narrowing with diffuse disc bulge and disc desiccation. Mild reactive endplate spurring. Superimposed broad-based left foraminal to extraforaminal disc protrusion contacts the exiting left L1 nerve root (series 14, image 10). No significant spinal stenosis. Mild left L1 foraminal narrowing. Right neural foramina remains patent.   L2-3: Degenerative intervertebral disc space narrowing with diffuse disc bulge and disc desiccation. Mild reactive endplate change. Mild to moderate right with mild left facet hypertrophy. Resultant mild narrowing of the right lateral recess. Central canal remains patent. No significant foraminal encroachment.   L3-4: Degenerative intervertebral disc space narrowing with diffuse circumferential disc bulge. Mild reactive endplate spurring. Superimposed right foraminal to extraforaminal disc protrusion contacts the exiting right L3 nerve root (series 14, image 23). Moderate right with mild left facet hypertrophy. Borderline mild spinal stenosis. Mild right L3 foraminal narrowing. Left neural foramen remains patent.   L4-5: Advanced degenerative intervertebral disc space narrowing with disc desiccation and diffuse disc bulge. Prominent discogenic reactive endplate change with associated endplate spurring and marrow edema. Mild bilateral facet hypertrophy. Resultant mild spinal stenosis. Mild right L4 foraminal narrowing. Left neural foramina remains patent. No frank impingement.   L5-S1: In man suggestive intervertebral disc space narrowing with diffuse disc bulge and disc desiccation. Prominent reactive endplate spurring with associated marrow edema. Superimposed broad base central  disc protrusion mildly indents the ventral thecal sac (series 14, image 35). Mild facet hypertrophy. No significant spinal stenosis. Mild right with moderate left L5 foraminal stenosis.   IMPRESSION: 1. Moderate to advanced multilevel lumbar  spondylosis, most pronounced at L4-5 and L5-S1. Prominent discogenic reactive endplate change with marrow edema at L4-5 and L5-S1 could contribute to lower back pain. 2. Broad-based left foraminal to extraforaminal disc protrusion at L1-2, contacting and potentially irritating the exiting left L1 nerve root. 3. Right foraminal to extraforaminal disc protrusion at L3-4, contacting and potentially irritating the exiting right L3 nerve root. 4. Mild edema within the left greater than right lower posterior paraspinous musculature, suggesting muscular injury/strain.     Electronically Signed   By: Jeannine Boga M.D.   On: 01/24/2021 05:01   He reports that he has been smoking cigarettes. He started smoking about 18 years ago. He has a 8.00 pack-year smoking history. He has never used smokeless tobacco. No results for input(s): HGBA1C, LABURIC in the last 8760 hours.  Objective:  VS:  HT:     WT:    BMI:      BP:130/81   HR:73bpm   TEMP: ( )   RESP:  Physical Exam Vitals and nursing note reviewed.  HENT:     Head: Normocephalic and atraumatic.     Right Ear: External ear normal.     Left Ear: External ear normal.     Nose: Nose normal.     Mouth/Throat:     Mouth: Mucous membranes are moist.  Eyes:     Extraocular Movements: Extraocular movements intact.  Cardiovascular:     Rate and Rhythm: Normal rate.     Pulses: Normal pulses.  Pulmonary:     Effort: Pulmonary effort is normal.  Abdominal:     General: Abdomen is flat. There is no distension.  Musculoskeletal:        General: Tenderness present.     Cervical back: Normal range of motion.     Comments: Pt rises from seated position to standing without difficulty. Good lumbar range of motion. Strong distal strength without clonus, no pain upon palpation of greater trochanters. Sensation intact bilaterally. Dysesthesias noted to right L3 dermatome. Walks independently, gait steady. Equivocally positive slump test.     Skin:    General: Skin is warm and dry.     Capillary Refill: Capillary refill takes less than 2 seconds.  Neurological:     General: No focal deficit present.     Mental Status: He is alert and oriented to person, place, and time.  Psychiatric:        Mood and Affect: Mood normal.        Behavior: Behavior normal.    Ortho Exam  Imaging: No results found.  Past Medical/Family/Surgical/Social History: Medications & Allergies reviewed per EMR, new medications updated. Patient Active Problem List   Diagnosis Date Noted   Hx of adenomatous colonic polyps 05/10/2016   Past Medical History:  Diagnosis Date   Anaphylactic reaction    Meat - alpha gal   Gastrointestinal hemorrhage after colonoscopic polypectomy    Hx of adenomatous colonic polyps 05/10/2016   Urinary frequency    Family History  Problem Relation Age of Onset   Colon cancer Neg Hx    Colon polyps Neg Hx    Esophageal cancer Neg Hx    Rectal cancer Neg Hx    Stomach cancer Neg Hx    Past Surgical History:  Procedure Laterality  Date   ANKLE FRACTURE SURGERY     COLONOSCOPY  2018   w/bleed and following flex sigmoid   FLEXIBLE SIGMOIDOSCOPY N/A 05/02/2016   Procedure: FLEXIBLE SIGMOIDOSCOPY;  Surgeon: Milus Banister, MD;  Location: [facility];  Service: Endoscopy;  Laterality: N/A;   PROSTATE SURGERY     RHINOPLASTY     Social History   Occupational History    Employer: [employer]  Tobacco Use   Smoking status: Every Day    Packs/day: 0.50    Years: 16.00    Pack years: 8.00    Types: Cigarettes    Start date: 2005   Smokeless tobacco: Never   Tobacco comments:    smokes a pack per day as of 05/12/20  Substance and Sexual Activity   Alcohol use: Yes    Alcohol/week: 16.0 standard drinks    Types: 12 Cans of beer, 4 Shots of liquor per week   Drug use: No   Sexual activity: Not on file

## 2021-01-28 NOTE — Progress Notes (Signed)
Pt state lower back pain that travels to his groin right groin area. Pt state standing and getting up from a sitting position or out the bed. Pt state takes over the counter pain meds and bioFreeze. Pt state he has a spot on his back that hurts and it also hurts his neck and right shoulder  Numeric Pain Rating Scale and Functional Assessment Average Pain 10 Pain Right Now 8 My pain is intermittent, constant, sharp, dull, stabbing, tingling, and aching Pain is worse with: bending, sitting, standing, and some activites Pain improves with: heat/ice and medication   In the last MONTH (on 0-10 scale) has pain interfered with the following?  1. General activity like being  able to carry out your everyday physical activities such as walking, climbing stairs, carrying groceries, or moving a chair?  Rating(7)  2. Relation with others like being able to carry out your usual social activities and roles such as  activities at home, at work and in your community. Rating(8)  3. Enjoyment of life such that you have  been bothered by emotional problems such as feeling anxious, depressed or irritable?  Rating(9)

## 2021-02-01 ENCOUNTER — Other Ambulatory Visit: Payer: Self-pay

## 2021-02-01 ENCOUNTER — Encounter: Payer: Self-pay | Admitting: Physical Medicine and Rehabilitation

## 2021-02-01 ENCOUNTER — Ambulatory Visit: Payer: Self-pay

## 2021-02-01 ENCOUNTER — Ambulatory Visit (INDEPENDENT_AMBULATORY_CARE_PROVIDER_SITE_OTHER): Payer: BC Managed Care – PPO | Admitting: Physical Medicine and Rehabilitation

## 2021-02-01 VITALS — BP 108/69 | HR 69

## 2021-02-01 DIAGNOSIS — M5416 Radiculopathy, lumbar region: Secondary | ICD-10-CM | POA: Diagnosis not present

## 2021-02-01 MED ORDER — METHYLPREDNISOLONE ACETATE 80 MG/ML IJ SUSP
80.0000 mg | Freq: Once | INTRAMUSCULAR | Status: AC
  Administered 2021-02-01: 80 mg

## 2021-02-01 NOTE — Patient Instructions (Signed)

## 2021-02-01 NOTE — Progress Notes (Signed)
Pt state lower back pain that travels to his groin right groin area. Pt state standing and getting up from a sitting position or out the bed. Pt state takes over the counter pain meds and bioFreeze to help ease his pain.  Numeric Pain Rating Scale and Functional Assessment Average Pain 5   In the last MONTH (on 0-10 scale) has pain interfered with the following?  1. General activity like being  able to carry out your everyday physical activities such as walking, climbing stairs, carrying groceries, or moving a chair?  Rating(9)   +Driver, -BT, -Dye Allergies.

## 2021-02-26 NOTE — Procedures (Signed)
Lumbosacral Transforaminal Epidural Steroid Injection - Sub-Pedicular Approach with Fluoroscopic Guidance  Patient: Joseph Mccoy      Date of Birth: July 18, 1955 MRN: [id number] PCP: Donnajean Lopes, MD      Visit Date: 02/01/2021   Universal Protocol:    Date/Time: 02/01/2021  Consent Given By: the patient  Position: PRONE  Additional Comments: Vital signs were monitored before and after the procedure. Patient was prepped and draped in the usual sterile fashion. The correct patient, procedure, and site was verified.   Injection Procedure Details:   Procedure diagnoses: Lumbar radiculopathy [M54.16]    Meds Administered:  Meds ordered this encounter  Medications   methylPREDNISolone acetate (DEPO-MEDROL) injection 80 mg    Laterality: Right  Location/Site: L3  Needle:5.0 in., 22 ga.  Short bevel or Quincke spinal needle  Needle Placement: Transforaminal  Findings:    -Comments: Excellent flow of contrast along the nerve, nerve root and into the epidural space.  Procedure Details: After squaring off the end-plates to get a true AP view, the C-arm was positioned so that an oblique view of the foramen as noted above was visualized. The target area is just inferior to the "nose of the scotty dog" or sub pedicular. The soft tissues overlying this structure were infiltrated with 2-3 ml. of 1% Lidocaine without Epinephrine.  The spinal needle was inserted toward the target using a "trajectory" view along the fluoroscope beam.  Under AP and lateral visualization, the needle was advanced so it did not puncture dura and was located close the 6 O'Clock position of the pedical in AP tracterory. Biplanar projections were used to confirm position. Aspiration was confirmed to be negative for CSF and/or blood. A 1-2 ml. volume of Isovue-250 was injected and flow of contrast was noted at each level. Radiographs were obtained for documentation purposes.   After attaining the desired flow  of contrast documented above, a 0.5 to 1.0 ml test dose of 0.25% Marcaine was injected into each respective transforaminal space.  The patient was observed for 90 seconds post injection.  After no sensory deficits were reported, and normal lower extremity motor function was noted,   the above injectate was administered so that equal amounts of the injectate were placed at each foramen (level) into the transforaminal epidural space.   Additional Comments:  The patient tolerated the procedure well Dressing: 2 x 2 sterile gauze and Band-Aid    Post-procedure details: Patient was observed during the procedure. Post-procedure instructions were reviewed.  Patient left the clinic in stable condition.

## 2021-02-26 NOTE — Progress Notes (Signed)
Joseph Mccoy - 66 y.o. male MRN [id number]  Date of birth: December 29, 1955  Office Visit Note: Visit Date: 02/01/2021 PCP: Donnajean Lopes, MD Referred by: Donnajean Lopes, MD  Subjective: Chief Complaint  Patient presents with   Lower Back - Pain   HPI:  Joseph Mccoy is a 66 y.o. male who comes in today at the request of Barnet Pall, FNP for planned Right L3-4 Lumbar Transforaminal epidural steroid injection with fluoroscopic guidance.  The patient has failed conservative care including home exercise, medications, time and activity modification.  This injection will be diagnostic and hopefully therapeutic.  Please see requesting physician notes for further details and justification.  ROS Otherwise per HPI.  Assessment & Plan: Visit Diagnoses:    ICD-10-CM   1. Lumbar radiculopathy  M54.16 XR C-ARM NO REPORT    Epidural Steroid injection    methylPREDNISolone acetate (DEPO-MEDROL) injection 80 mg      Plan: No additional findings.   Meds & Orders:  Meds ordered this encounter  Medications   methylPREDNISolone acetate (DEPO-MEDROL) injection 80 mg    Orders Placed This Encounter  Procedures   XR C-ARM NO REPORT   Epidural Steroid injection    Follow-up: Return if symptoms worsen or fail to improve.   Procedures: No procedures performed  Lumbosacral Transforaminal Epidural Steroid Injection - Sub-Pedicular Approach with Fluoroscopic Guidance  Patient: Joseph Mccoy      Date of Birth: 1955-10-18 MRN: [id number] PCP: Donnajean Lopes, MD      Visit Date: 02/01/2021   Universal Protocol:    Date/Time: 02/01/2021  Consent Given By: the patient  Position: PRONE  Additional Comments: Vital signs were monitored before and after the procedure. Patient was prepped and draped in the usual sterile fashion. The correct patient, procedure, and site was verified.   Injection Procedure Details:   Procedure diagnoses: Lumbar radiculopathy [M54.16]    Meds  Administered:  Meds ordered this encounter  Medications   methylPREDNISolone acetate (DEPO-MEDROL) injection 80 mg    Laterality: Right  Location/Site: L3  Needle:5.0 in., 22 ga.  Short bevel or Quincke spinal needle  Needle Placement: Transforaminal  Findings:    -Comments: Excellent flow of contrast along the nerve, nerve root and into the epidural space.  Procedure Details: After squaring off the end-plates to get a true AP view, the C-arm was positioned so that an oblique view of the foramen as noted above was visualized. The target area is just inferior to the "nose of the scotty dog" or sub pedicular. The soft tissues overlying this structure were infiltrated with 2-3 ml. of 1% Lidocaine without Epinephrine.  The spinal needle was inserted toward the target using a "trajectory" view along the fluoroscope beam.  Under AP and lateral visualization, the needle was advanced so it did not puncture dura and was located close the 6 O'Clock position of the pedical in AP tracterory. Biplanar projections were used to confirm position. Aspiration was confirmed to be negative for CSF and/or blood. A 1-2 ml. volume of Isovue-250 was injected and flow of contrast was noted at each level. Radiographs were obtained for documentation purposes.   After attaining the desired flow of contrast documented above, a 0.5 to 1.0 ml test dose of 0.25% Marcaine was injected into each respective transforaminal space.  The patient was observed for 90 seconds post injection.  After no sensory deficits were reported, and normal lower extremity motor function was noted,   the above injectate was administered so that  equal amounts of the injectate were placed at each foramen (level) into the transforaminal epidural space.   Additional Comments:  The patient tolerated the procedure well Dressing: 2 x 2 sterile gauze and Band-Aid    Post-procedure details: Patient was observed during the procedure. Post-procedure  instructions were reviewed.  Patient left the clinic in stable condition.    Clinical History: EXAM: MRI LUMBAR SPINE WITHOUT CONTRAST   TECHNIQUE: Multiplanar, multisequence MR imaging of the lumbar spine was performed. No intravenous contrast was administered.   COMPARISON:  None available.   FINDINGS: Segmentation: Standard. Lowest well-formed disc space labeled the L5-S1 level.   Alignment: Mild levoscoliosis with straightening of the normal lumbar lordosis. No significant listhesis.   Vertebrae: Vertebral body height maintained without acute or chronic fracture. Bone marrow signal intensity diffusely heterogeneous without worrisome osseous lesion. Scattered discogenic reactive endplate changes present throughout the lumbar spine, most pronounced at the L4-5 and L5-S1 levels where there is associated prominent reactive marrow edema.   Conus medullaris and cauda equina: Conus extends to the L1-2 level. Conus and cauda equina appear normal.   Paraspinal and other soft tissues: Mild edema present within the left greater than right lower posterior paraspinous musculature, suggesting muscular injury/strain (series 8, image 14). Paraspinous soft tissues demonstrate no other acute finding. Subcentimeter simple cyst noted at the visualized lower right kidney. Visualized visceral structures otherwise unremarkable.   Disc levels:   L1-2: Degenerative intervertebral disc space narrowing with diffuse disc bulge and disc desiccation. Mild reactive endplate spurring. Superimposed broad-based left foraminal to extraforaminal disc protrusion contacts the exiting left L1 nerve root (series 14, image 10). No significant spinal stenosis. Mild left L1 foraminal narrowing. Right neural foramina remains patent.   L2-3: Degenerative intervertebral disc space narrowing with diffuse disc bulge and disc desiccation. Mild reactive endplate change. Mild to moderate right with mild left  facet hypertrophy. Resultant mild narrowing of the right lateral recess. Central canal remains patent. No significant foraminal encroachment.   L3-4: Degenerative intervertebral disc space narrowing with diffuse circumferential disc bulge. Mild reactive endplate spurring. Superimposed right foraminal to extraforaminal disc protrusion contacts the exiting right L3 nerve root (series 14, image 23). Moderate right with mild left facet hypertrophy. Borderline mild spinal stenosis. Mild right L3 foraminal narrowing. Left neural foramen remains patent.   L4-5: Advanced degenerative intervertebral disc space narrowing with disc desiccation and diffuse disc bulge. Prominent discogenic reactive endplate change with associated endplate spurring and marrow edema. Mild bilateral facet hypertrophy. Resultant mild spinal stenosis. Mild right L4 foraminal narrowing. Left neural foramina remains patent. No frank impingement.   L5-S1: In man suggestive intervertebral disc space narrowing with diffuse disc bulge and disc desiccation. Prominent reactive endplate spurring with associated marrow edema. Superimposed broad base central disc protrusion mildly indents the ventral thecal sac (series 14, image 35). Mild facet hypertrophy. No significant spinal stenosis. Mild right with moderate left L5 foraminal stenosis.   IMPRESSION: 1. Moderate to advanced multilevel lumbar spondylosis, most pronounced at L4-5 and L5-S1. Prominent discogenic reactive endplate change with marrow edema at L4-5 and L5-S1 could contribute to lower back pain. 2. Broad-based left foraminal to extraforaminal disc protrusion at L1-2, contacting and potentially irritating the exiting left L1 nerve root. 3. Right foraminal to extraforaminal disc protrusion at L3-4, contacting and potentially irritating the exiting right L3 nerve root. 4. Mild edema within the left greater than right lower posterior paraspinous musculature,  suggesting muscular injury/strain.     Electronically Signed   By:  Jeannine Boga M.D.   On: 01/24/2021 05:01     Objective:  VS:  HT:     WT:    BMI:      BP:108/69   HR:69bpm   TEMP: ( )   RESP:  Physical Exam Vitals and nursing note reviewed.  Constitutional:      General: He is not in acute distress.    Appearance: Normal appearance. He is not ill-appearing.  HENT:     Head: Normocephalic and atraumatic.     Right Ear: External ear normal.     Left Ear: External ear normal.     Nose: No congestion.  Eyes:     Extraocular Movements: Extraocular movements intact.  Cardiovascular:     Rate and Rhythm: Normal rate.     Pulses: Normal pulses.  Pulmonary:     Effort: Pulmonary effort is normal. No respiratory distress.  Abdominal:     General: There is no distension.     Palpations: Abdomen is soft.  Musculoskeletal:        General: No tenderness or signs of injury.     Cervical back: Neck supple.     Right lower leg: No edema.     Left lower leg: No edema.     Comments: Patient has good distal strength without clonus.  Skin:    Findings: No erythema or rash.  Neurological:     General: No focal deficit present.     Mental Status: He is alert and oriented to person, place, and time.     Sensory: No sensory deficit.     Motor: No weakness or abnormal muscle tone.     Coordination: Coordination normal.  Psychiatric:        Mood and Affect: Mood normal.        Behavior: Behavior normal.     Imaging: No results found.

## 2021-03-09 ENCOUNTER — Encounter (HOSPITAL_COMMUNITY): Payer: BC Managed Care – PPO

## 2021-03-09 ENCOUNTER — Other Ambulatory Visit (HOSPITAL_COMMUNITY): Payer: Self-pay | Admitting: Internal Medicine

## 2021-03-09 ENCOUNTER — Ambulatory Visit (HOSPITAL_COMMUNITY): Admission: RE | Admit: 2021-03-09 | Payer: BC Managed Care – PPO | Source: Ambulatory Visit

## 2021-03-09 DIAGNOSIS — M79605 Pain in left leg: Secondary | ICD-10-CM

## 2021-05-16 ENCOUNTER — Ambulatory Visit
Admission: RE | Admit: 2021-05-16 | Discharge: 2021-05-16 | Disposition: A | Discharge status: Home / Self Care | Payer: BC Managed Care – PPO | Source: Ambulatory Visit | Attending: Acute Care | Admitting: Acute Care

## 2021-05-16 DIAGNOSIS — F17210 Nicotine dependence, cigarettes, uncomplicated: Secondary | ICD-10-CM

## 2021-05-16 DIAGNOSIS — Z87891 Personal history of nicotine dependence: Secondary | ICD-10-CM

## 2021-05-17 ENCOUNTER — Other Ambulatory Visit: Payer: Self-pay | Admitting: Acute Care

## 2021-05-17 DIAGNOSIS — Z122 Encounter for screening for malignant neoplasm of respiratory organs: Secondary | ICD-10-CM

## 2021-05-17 DIAGNOSIS — F17210 Nicotine dependence, cigarettes, uncomplicated: Secondary | ICD-10-CM

## 2021-05-17 DIAGNOSIS — Z87891 Personal history of nicotine dependence: Secondary | ICD-10-CM

## 2022-03-02 ENCOUNTER — Encounter: Payer: Self-pay | Admitting: Internal Medicine

## 2022-03-02 ENCOUNTER — Ambulatory Visit: Payer: BC Managed Care – PPO | Admitting: Internal Medicine

## 2022-03-02 VITALS — BP 126/72 | HR 78 | Ht 70.0 in | Wt 169.0 lb

## 2022-03-02 DIAGNOSIS — Z91018 Allergy to other foods: Secondary | ICD-10-CM

## 2022-03-02 DIAGNOSIS — R1319 Other dysphagia: Secondary | ICD-10-CM | POA: Diagnosis not present

## 2022-03-02 NOTE — Patient Instructions (Signed)
_______________________________________________________  If your blood pressure at your visit was 140/90 or greater, please contact your primary care physician to follow up on this.  _______________________________________________________  If you are age 67 or older, your body mass index should be between 23-30. Your Body mass index is 24.25 kg/m. If this is out of the aforementioned range listed, please consider follow up with your Primary Care Provider.  If you are age 63 or younger, your body mass index should be between 19-25. Your Body mass index is 24.25 kg/m. If this is out of the aformentioned range listed, please consider follow up with your Primary Care Provider.   ________________________________________________________  The [facility] [place] [facility] providers would like to encourage you to use [facility] to communicate with providers for non-urgent requests or questions.  Due to long hold times on the telephone, sending your provider a message by [facility] may be a faster and more efficient way to get a response.  Please allow 48 business hours for a response.  Please remember that this is for non-urgent requests.  _______________________________________________________  Dennis Bast have been scheduled for an endoscopy. Please follow written instructions given to you at your visit today. If you use inhalers (even only as needed), please bring them with you on the day of your procedure.  It was a pleasure to see you today!  Thank you for trusting me with your gastrointestinal care!

## 2022-03-02 NOTE — Progress Notes (Signed)
Joseph Mccoy 67 y.o. [birth year] [id number]  Assessment & Plan:   Encounter Diagnoses  Name Primary?   Esophageal dysphagia Yes   Allergy to alpha-gal    Evaluate with EGD and possible esophageal dilation.  History not entirely clear, he is describing mostly liquid dysphagia though wife says he has solid food dysphagia as well.  Could need barium swallow pending outcome of EGD.  The risks and benefits as well as alternatives of endoscopic procedure(s) have been discussed and reviewed. All questions answered. The patient agrees to proceed.    Subjective:   Chief Complaint: Choking  HPI 67 year old white man with history of colon polyps and alpha gal allergy here with his wife, he is having intermittent dysphagia that is increasing in frequency.  Several times a month at least.  He notices issues when he is drinking his coffee in the morning.  His wife says he has also had problems with solid food dysphagia at times.  He has to regurgitate food or liquid boluses.  I do not get a sense that there is aspiration or cough issues.  There is some occasional or rare heartburn at best.  He is not on any acid suppression.  He intentionally lost 85 pounds by eliminating sugar and other carbohydratesin 2021 or so  but says his weight has been level after that.  Wt Readings from Last 3 Encounters:  03/02/22 169 lb (76.7 kg)  10/05/20 172 lb (78 kg)  05/12/20 162 lb 3.2 oz (73.6 kg)    Colon polyp history: 3 adenomas max 12 mm - 07/2019 6 adenomas recall 2024  The patient had a post polypectomy bleed after colonoscopy in 2018 treated the next day by sigmoidoscopy.  Allergies  Allergen Reactions   Galactose Anaphylaxis   Ham Anaphylaxis    No ham, no beef, no pork   Other     Mammalian meat and other environmental allergies   Current Meds  Medication Sig   cyclobenzaprine (FLEXERIL) 5 MG tablet Take 5-10 mg by mouth at bedtime as needed.   diazepam (VALIUM) 5 MG tablet Take one  tablet by mouth with food one hour prior to procedure. May repeat 30 minutes prior if needed.   EPINEPHrine (EPIPEN 2-PAK) 0.3 mg/0.3 mL IJ SOAJ injection Inject 0.3 mLs (0.3 mg total) into the muscle once. Repeat in 5 minutes if necessary   meloxicam (MOBIC) 15 MG tablet Take 15 mg by mouth daily.   tamsulosin (FLOMAX) 0.4 MG CAPS capsule Take 0.4 mg by mouth daily.   tolterodine (DETROL LA) 2 MG 24 hr capsule Take 2 mg by mouth daily.   [DISCONTINUED] methocarbamol (ROBAXIN) 500 MG tablet Take 1 tablet (500 mg total) by mouth every 8 (eight) hours as needed for muscle spasms.   Past Medical History:  Diagnosis Date   Allergy to alpha-gal    Anaphylactic reaction    Meat - alpha gal   Gastrointestinal hemorrhage after colonoscopic polypectomy    Hx of adenomatous colonic polyps 05/10/2016   Urinary frequency    Past Surgical History:  Procedure Laterality Date   ANKLE FRACTURE SURGERY     COLONOSCOPY  2018   w/bleed and following flex sigmoid   FLEXIBLE SIGMOIDOSCOPY N/A 05/02/2016   Procedure: FLEXIBLE SIGMOIDOSCOPY;  Surgeon: Milus Banister, MD;  Location: [facility];  Service: Endoscopy;  Laterality: N/A;   PROSTATE SURGERY     RHINOPLASTY     Social History   Social History Narrative   Married to [place]  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   Review of Systems As per HPI  Objective:   Physical Exam '@BP'$  126/72   Pulse 78   Ht '5\' 10"'$  (1.778 m)   Wt 169 lb (76.7 kg)   BMI 24.25 kg/m @  General:  NAD Eyes:   Anicteric Neck:  No mass Mouth/pharynx: Dentition relatively intact no oral or pharyngeal lesions Lungs:  clear Heart::  S1S2 no rubs, murmurs or gallops Abdomen:  soft and nontender, BS+ Ext:   no edema, cyanosis or clubbing    Data  Reviewed:  See HPI

## 2022-03-15 ENCOUNTER — Telehealth: Payer: Self-pay | Admitting: Internal Medicine

## 2022-03-15 NOTE — Telephone Encounter (Signed)
Patients wife Marcie Bal called stating he had ankle surgery on 03-10-22 under general anesthesia.  His wife is concerned that it is not safe to have endoscopy on 03-23-22.  I explained to patients wife that he should be fine to go under anesthesia again as it had been almost 2 weeks since his surgery.  She states that he is able to ambulate with crutches and a scooter at times.  Patients wife agreed to plan and verbalized understanding.  No further questions.

## 2022-03-15 NOTE — Telephone Encounter (Signed)
Inbound call from patient's wife, stating patient has an upcoming procedure on 2/29. Patient's wife states he recently had ankle surgery and is wondering if this will interfere with his procedure. Please advise.

## 2022-03-23 ENCOUNTER — Ambulatory Visit (AMBULATORY_SURGERY_CENTER): Payer: BC Managed Care – PPO | Admitting: Internal Medicine

## 2022-03-23 ENCOUNTER — Encounter: Payer: Self-pay | Admitting: Internal Medicine

## 2022-03-23 VITALS — BP 114/81 | HR 96 | Temp 96.4°F | Resp 17 | Ht 70.0 in | Wt 169.0 lb

## 2022-03-23 DIAGNOSIS — K2990 Gastroduodenitis, unspecified, without bleeding: Secondary | ICD-10-CM

## 2022-03-23 DIAGNOSIS — K2950 Unspecified chronic gastritis without bleeding: Secondary | ICD-10-CM

## 2022-03-23 DIAGNOSIS — K222 Esophageal obstruction: Secondary | ICD-10-CM

## 2022-03-23 DIAGNOSIS — B9681 Helicobacter pylori [H. pylori] as the cause of diseases classified elsewhere: Secondary | ICD-10-CM

## 2022-03-23 DIAGNOSIS — R1319 Other dysphagia: Secondary | ICD-10-CM

## 2022-03-23 HISTORY — PX: ESOPHAGOGASTRODUODENOSCOPY (EGD) WITH ESOPHAGEAL DILATION: SHX5812

## 2022-03-23 MED ORDER — SODIUM CHLORIDE 0.9 % IV SOLN
500.0000 mL | Freq: Once | INTRAVENOUS | Status: DC
Start: 2022-03-23 — End: 2022-03-31

## 2022-03-23 NOTE — Patient Instructions (Addendum)
There was inflammation in the stomach and upper intestine - I took biopsies to understand, ? Infection. There was a stricture or narrow spot at junction of esophagus and stomach w/ slight inflammation. I opened this up to help you swallow better. Also have a small hiatal hernia.  Once I see pathology results will make additional recommendations.  I appreciate the opportunity to care for you. Gatha Mayer, MD, FACG  YOU HAD AN ENDOSCOPIC PROCEDURE TODAY AT [facility]:   Refer to the procedure report that was given to you for any specific questions about what was found during the examination.  If the procedure report does not answer your questions, please call your gastroenterologist to clarify.  If you requested that your care partner not be given the details of your procedure findings, then the procedure report has been included in a sealed envelope for you to review at your convenience later.  YOU SHOULD EXPECT: Some feelings of bloating in the abdomen. Passage of more gas than usual.  Walking can help get rid of the air that was put into your GI tract during the procedure and reduce the bloating. If you had a lower endoscopy (such as a colonoscopy or flexible sigmoidoscopy) you may notice spotting of blood in your stool or on the toilet paper. If you underwent a bowel prep for your procedure, you may not have a normal bowel movement for a few days.  Please Note:  You might notice some irritation and congestion in your nose or some drainage.  This is from the oxygen used during your procedure.  There is no need for concern and it should clear up in a day or so.  SYMPTOMS TO REPORT IMMEDIATELY:  Following lower endoscopy (colonoscopy or flexible sigmoidoscopy):  Excessive amounts of blood in the stool  Significant tenderness or worsening of abdominal pains  Swelling of the abdomen that is new, acute  Fever of 100F or higher  Following upper endoscopy (EGD)  Vomiting of  blood or coffee ground material  New chest pain or pain under the shoulder blades  Painful or persistently difficult swallowing  New shortness of breath  Fever of 100F or higher  Black, tarry-looking stools  For urgent or emergent issues, a gastroenterologist can be reached at any hour by calling [phone]. Do not use MyChart messaging for urgent concerns.    DIET:  We do recommend a small meal at first, but then you may proceed to your regular diet.  Drink plenty of fluids but you should avoid alcoholic beverages for 24 hours.  ACTIVITY:  You should plan to take it easy for the rest of today and you should NOT DRIVE or use heavy machinery until tomorrow (because of the sedation medicines used during the test).    FOLLOW UP: Our staff will call the number listed on your records the next business day following your procedure.  We will call around 7:15- 8:00 am to check on you and address any questions or concerns that you may have regarding the information given to you following your procedure. If we do not reach you, we will leave a message.     If any biopsies were taken you will be contacted by phone or by letter within the next 1-3 weeks.  Please call us at [phone] if you have not heard about the biopsies in 3 weeks.    SIGNATURES/CONFIDENTIALITY: You and/or your care partner have signed paperwork which will be entered into your  electronic medical record.  These signatures attest to the fact that that the information above on your After Visit Summary has been reviewed and is understood.  Full responsibility of the confidentiality of this discharge information lies with you and/or your care-partner.

## 2022-03-23 NOTE — Progress Notes (Signed)
Uneventful anesthetic. Report to pacu rn. Vss. Care resumed by rn.

## 2022-03-23 NOTE — Progress Notes (Signed)
Called to room to assist during endoscopic procedure.  Patient ID and intended procedure confirmed with present staff. Received instructions for my participation in the procedure from the performing physician.  

## 2022-03-23 NOTE — Op Note (Signed)
[facility] Patient Name: Joseph Mccoy Procedure Date: 03/23/2022 10:37 AM MRN: [id number] Endoscopist: Gatha Mayer , MD, [id number] Age: 67 Referring MD:  Date of Birth: Mar 20, 1955 Gender: Male Account #: [id number] Procedure:                Upper GI endoscopy Indications:              Dysphagia Medicines:                Monitored Anesthesia Care Procedure:                Pre-Anesthesia Assessment:                           - Prior to the procedure, a History and Physical                            was performed, and patient medications and                            allergies were reviewed. The patient's tolerance of                            previous anesthesia was also reviewed. The risks                            and benefits of the procedure and the sedation                            options and risks were discussed with the patient.                            All questions were answered, and informed consent                            was obtained. Prior Anticoagulants: The patient has                            taken no anticoagulant or antiplatelet agents. ASA                            Grade Assessment: II - A patient with mild systemic                            disease. After reviewing the risks and benefits,                            the patient was deemed in satisfactory condition to                            undergo the procedure.                           After obtaining informed consent, the endoscope was  passed under direct vision. Throughout the                            procedure, the patient's blood pressure, pulse, and                            oxygen saturations were monitored continuously. The                            Olympus Scope [id number] was introduced through the                            mouth, and advanced to the second part of duodenum.                            The upper GI endoscopy was accomplished  without                            difficulty. The patient tolerated the procedure                            fairly well. Scope In: Scope Out: Findings:                 One benign-appearing, intrinsic moderate                            (circumferential scarring or stenosis; an endoscope                            may pass) stenosis was found at the                            gastroesophageal junction. This stenosis measured                            less than one cm (in length). The stenosis was                            traversed. A TTS dilator was passed through the                            scope. Dilation with an 18-19-20 mm balloon dilator                            was performed to 20 mm. The dilation site was                            examined and showed mild mucosal disruption. this                            was furthered by forceps disruption. Estimated  blood loss was minimal.                           A 3 cm hiatal hernia was present.                           Diffuse mild inflammation characterized by                            congestion (edema) and erythema was found in the                            entire examined stomach. Biopsies were taken with a                            cold forceps for histology. Verification of patient                            identification for the specimen was done. Estimated                            blood loss was minimal.                           Patchy mild inflammation characterized by                            congestion (edema) and erythema was found in the                            duodenal bulb.                           The cardia and gastric fundus were otherwise normal                            on retroflexion.                           The exam was otherwise without abnormality. Complications:            No immediate complications. Estimated Blood Loss:     Estimated blood loss was  minimal. Impression:               - Benign-appearing esophageal stenosis. Dilated.                            Associated slight inflammation/esophagitis                           - 3 cm hiatal hernia.                           - Gastritis. Biopsied.                           - Duodenitis.                           -  The examination was otherwise normal. Recommendation:           - Patient has a contact number available for                            emergencies. The signs and symptoms of potential                            delayed complications were discussed with the                            patient. Return to normal activities tomorrow.                            Written discharge instructions were provided to the                            patient.                           - Clear liquids x 1 hour then soft foods rest of                            day. Start prior diet tomorrow.                           - Continue present medications.                           - Await pathology results. Treat H pylori if +,                            likely needs GERD lifestuyle hanges and PPI Gatha Mayer, MD 03/23/2022 11:11:30 AM This report has been signed electronically.

## 2022-03-23 NOTE — Progress Notes (Signed)
History and Physical Interval Note:  03/23/2022 10:38 AM  Joseph Mccoy  has presented today for endoscopic procedure(s), with the diagnosis of  Encounter Diagnosis  Name Primary?   Esophageal dysphagia Yes  .  The various methods of evaluation and treatment have been discussed with the patient and/or family. After consideration of risks, benefits and other options for treatment, the patient has consented to  the endoscopic procedure(s).   The patient's history has been reviewed, patient examined, no change in status, stable for endoscopic procedure(s).  I have reviewed the patient's chart and labs.  Questions were answered to the patient's satisfaction.     Gatha Mayer, MD, [facility]

## 2022-03-24 ENCOUNTER — Telehealth: Payer: Self-pay | Admitting: *Deleted

## 2022-03-24 NOTE — Telephone Encounter (Signed)
No answer on follow up call. Mail box full and not able to leave message.

## 2022-03-29 ENCOUNTER — Telehealth: Payer: Self-pay | Admitting: Internal Medicine

## 2022-03-29 DIAGNOSIS — B9681 Helicobacter pylori [H. pylori] as the cause of diseases classified elsewhere: Secondary | ICD-10-CM

## 2022-03-29 DIAGNOSIS — K2970 Gastritis, unspecified, without bleeding: Secondary | ICD-10-CM

## 2022-03-29 NOTE — Telephone Encounter (Signed)
Pt wife Marcie Bal stated that she seen the pt pathology on my chart which read that pt was positive for  H Pylori and was wondering if Dr. Carlean Purl would treat him for that. Marcie Bal was made aware that I will send Dr. Carlean Purl a message and make his aware of the pt  pathology for him to review. Marcie Bal notified that Dr. Carlean Purl is out of the office the rest of the week and will return next week. Marcie Bal  verbalized understanding with all questions answered.

## 2022-03-29 NOTE — Telephone Encounter (Signed)
Patient's wife is calling wondering if patient is supposed to be taking anything for his gastritis. Please advise

## 2022-03-31 ENCOUNTER — Other Ambulatory Visit: Payer: Self-pay

## 2022-03-31 DIAGNOSIS — K219 Gastro-esophageal reflux disease without esophagitis: Secondary | ICD-10-CM

## 2022-03-31 DIAGNOSIS — B9681 Helicobacter pylori [H. pylori] as the cause of diseases classified elsewhere: Secondary | ICD-10-CM

## 2022-03-31 DIAGNOSIS — K2970 Gastritis, unspecified, without bleeding: Secondary | ICD-10-CM | POA: Insufficient documentation

## 2022-03-31 HISTORY — DX: Gastritis, unspecified, without bleeding: B96.81

## 2022-03-31 MED ORDER — BISMUTH SUBSALICYLATE 262 MG PO CHEW: 524.0000 mg | CHEWABLE_TABLET | Freq: Four times a day (QID) | ORAL | 0 refills | Status: AC

## 2022-03-31 MED ORDER — DOXYCYCLINE HYCLATE 100 MG PO CAPS: 100.0000 mg | ORAL_CAPSULE | Freq: Two times a day (BID) | ORAL | 0 refills | Status: AC

## 2022-03-31 MED ORDER — OMEPRAZOLE 40 MG PO CPDR: 40.0000 mg | DELAYED_RELEASE_CAPSULE | Freq: Every day | ORAL | 3 refills | Status: DC

## 2022-03-31 MED ORDER — METRONIDAZOLE 250 MG PO TABS: 250.0000 mg | ORAL_TABLET | Freq: Four times a day (QID) | ORAL | 0 refills | Status: AC

## 2022-03-31 NOTE — Telephone Encounter (Signed)
1) Omeprazole 40 mg daily before breakfast # 90 3 refills (for GERD) 2) Pepto Bismol 2 tabs (262 mg each) 4 times a day x 14 d 3) Metronidazole 250 mg 4 times a day x 14 d 4) doxycycline 100 mg 2 times a day x 14 d  F/U me in office 6-8 weeks

## 2022-03-31 NOTE — Telephone Encounter (Signed)
Pt wife Marcie Bal made aware of Dr. Carlean Purl recommendations: Prescriptions sent to pharmacy: Marcie Bal  made aware: Pt scheduled for a follow up Appointment on 05/24/2022 at 8:50 AM. Marcie Bal made aware. Marcie Bal  verbalized understanding with all questions answered.

## 2022-05-18 ENCOUNTER — Ambulatory Visit
Admission: RE | Admit: 2022-05-18 | Discharge: 2022-05-18 | Disposition: A | Discharge status: Home / Self Care | Payer: BC Managed Care – PPO | Source: Ambulatory Visit | Attending: Acute Care | Admitting: Acute Care

## 2022-05-18 DIAGNOSIS — F17210 Nicotine dependence, cigarettes, uncomplicated: Secondary | ICD-10-CM

## 2022-05-18 DIAGNOSIS — Z122 Encounter for screening for malignant neoplasm of respiratory organs: Secondary | ICD-10-CM

## 2022-05-18 DIAGNOSIS — Z87891 Personal history of nicotine dependence: Secondary | ICD-10-CM

## 2022-05-23 ENCOUNTER — Other Ambulatory Visit: Payer: Self-pay

## 2022-05-23 DIAGNOSIS — Z87891 Personal history of nicotine dependence: Secondary | ICD-10-CM

## 2022-05-23 DIAGNOSIS — F17210 Nicotine dependence, cigarettes, uncomplicated: Secondary | ICD-10-CM

## 2022-05-24 ENCOUNTER — Ambulatory Visit: Payer: BC Managed Care – PPO | Admitting: Internal Medicine

## 2022-05-24 ENCOUNTER — Encounter: Payer: Self-pay | Admitting: Internal Medicine

## 2022-05-24 VITALS — BP 130/80 | HR 96 | Ht 70.0 in | Wt 164.0 lb

## 2022-05-24 DIAGNOSIS — K2970 Gastritis, unspecified, without bleeding: Secondary | ICD-10-CM | POA: Diagnosis not present

## 2022-05-24 DIAGNOSIS — K222 Esophageal obstruction: Secondary | ICD-10-CM | POA: Diagnosis not present

## 2022-05-24 DIAGNOSIS — B9681 Helicobacter pylori [H. pylori] as the cause of diseases classified elsewhere: Secondary | ICD-10-CM

## 2022-05-24 DIAGNOSIS — K219 Gastro-esophageal reflux disease without esophagitis: Secondary | ICD-10-CM | POA: Diagnosis not present

## 2022-05-24 NOTE — Progress Notes (Signed)
Joseph Mccoy 67 y.o. September 07, 1955 [id number]  Assessment & Plan:   Encounter Diagnoses  Name Primary?   Helicobacter pylori gastritis Yes   GERD with stricture    He is improved.  No dysphagia.  Continue PPI to reduce recurrence of stricture.  He has completed therapy for H. pylori gastritis.  We will do stool testing for H. pylori today, Diatherix test which does not require holding PPI.  Follow-up as needed.      Subjective:   Chief Complaint: Follow-up after H. pylori treatment and GERD with stricture  HPI 67 year old white male, presents for follow-up, with history of GERD, had EGD 03/23/2022 with esophageal dilation to 20 mm for reflux associated stricture at GE junction, also had changes suspicious for gastritis and had H. pylori.  He was treated with quadruple therapy (PPI, Pepto-Bismol, metronidazole and amoxicillin).  Tolerated that fine.  He reports no symptoms of reflux or dysphagia or abdominal pain at this point.  He is continuing to recover from a left ankle fracture.  It is slow.  He also has chronic back pain and has been out of work since November 2023.  He remains on Mobic.  Wt Readings from Last 3 Encounters:  05/24/22 164 lb (74.4 kg)  03/23/22 169 lb (76.7 kg)  03/02/22 169 lb (76.7 kg)    Allergies  Allergen Reactions   Galactose Anaphylaxis   Ham Anaphylaxis    No ham, no beef, no pork   Other     Mammalian meat and other environmental allergies   Current Meds  Medication Sig   cyclobenzaprine (FLEXERIL) 5 MG tablet Take 5-10 mg by mouth at bedtime as needed.   diazepam (VALIUM) 5 MG tablet Take one tablet by mouth with food one hour prior to procedure. May repeat 30 minutes prior if needed.   meloxicam (MOBIC) 15 MG tablet Take 15 mg by mouth daily.   omeprazole (PRILOSEC) 40 MG capsule Take 1 capsule (40 mg total) by mouth daily.   tamsulosin (FLOMAX) 0.4 MG CAPS capsule Take 0.4 mg by mouth daily.   tolterodine (DETROL LA) 2 MG 24 hr  capsule Take 2 mg by mouth daily.   Past Medical History:  Diagnosis Date   Allergy to alpha-gal    Anaphylactic reaction    Meat - alpha gal   Gastrointestinal hemorrhage after colonoscopic polypectomy    H. 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 also   He is a Surveyor, minerals also   Cigarette smoker, 12 beers and 4 shots of whiskey a week and no drug use no other tobacco   family history is not on file.   Review of Systems  See HPI Objective:   Physical Exam BP 130/80 (BP Location: Left Arm, Patient Position: Sitting, Cuff Size: Normal)   Pulse 96   Ht 5\' 10"  (1.778 m)   Wt 164 lb (74.4 kg) Comment:  with surgical boot  BMI 23.53 kg/m

## 2022-05-24 NOTE — Patient Instructions (Signed)
  Your provider has ordered "Diatherix" stool testing for you. You have received a kit from our office today containing all necessary supplies to complete this test. Please carefully read the stool collection instructions provided in the kit before opening the accompanying materials. In addition, be sure to place the label from the top left corner of the laboratory request sheet onto the "puritan opti-swab" tube that is supplied in the kit. This label should include your full name and date of birth. After completing the test, you should secure the purtian tube into the specimen biohazard bag. The laboratory request information sheet (including date and time of specimen collection) should be placed into the outside pocket of the specimen biohazard bag and returned to the [facility] lab with 2 days of collection.   Follow up as needed.  _______________________________________________________  If your blood pressure at your visit was 140/90 or greater, please contact your primary care physician to follow up on this.  _______________________________________________________  If you are age 72 or older, your body mass index should be between 23-30. Your Body mass index is 23.53 kg/m. If this is out of the aforementioned range listed, please consider follow up with your Primary Care Provider.  If you are age 52 or younger, your body mass index should be between 19-25. Your Body mass index is 23.53 kg/m. If this is out of the aformentioned range listed, please consider follow up with your Primary Care Provider.   ________________________________________________________  The [facility] providers would like to encourage you to use [facility] to communicate with providers for non-urgent requests or questions.  Due to long hold times on the telephone, sending your provider a message by [facility] may be a faster and more efficient way to get a response.  Please allow 48 business hours for a response.  Please remember  that this is for non-urgent requests.  _______________________________________________________

## 2022-05-25 IMAGING — CT CT CHEST LUNG CANCER SCREENING LOW DOSE W/O CM
1 series · 15 of 31 positions shown, 19 images · non-contrast
Comparison: 05/12/2020

CLINICAL DATA: 66-year-old male with 35 pack-year history of
smoking. Lung cancer screening.



[Series 2: ldct screening <30 bmi · axial · 0.82mm/px · z∈[-430,-65]mm · 15 of 79 slices shown, 19 images]
[im 3/79  mediastinal]
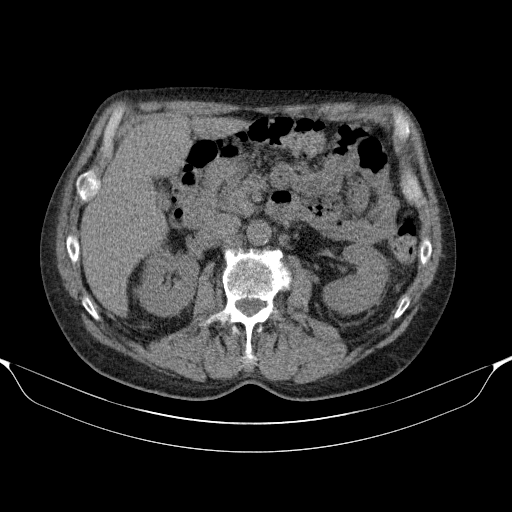
[im 3/79  lung]
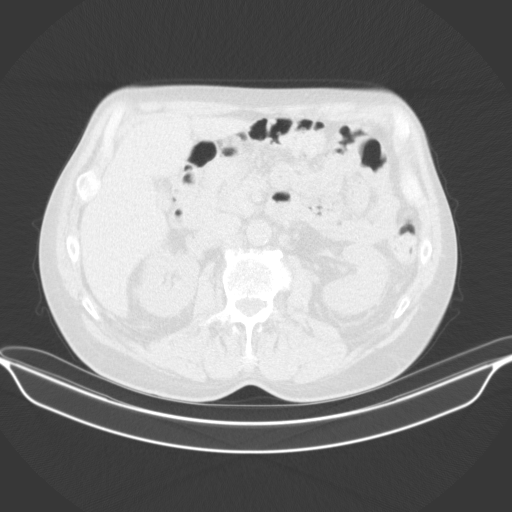
[im 9/79  lung]
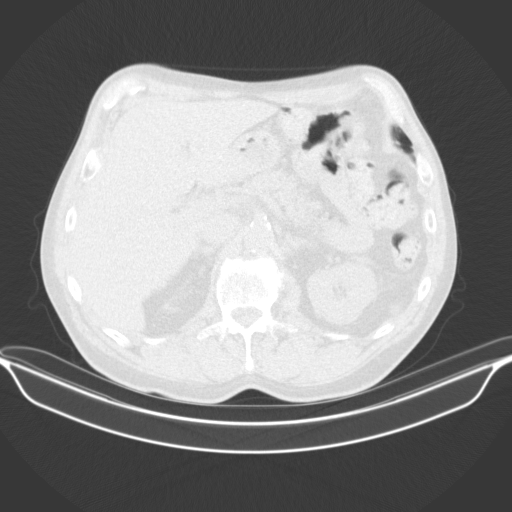
[im 15/79  lung]
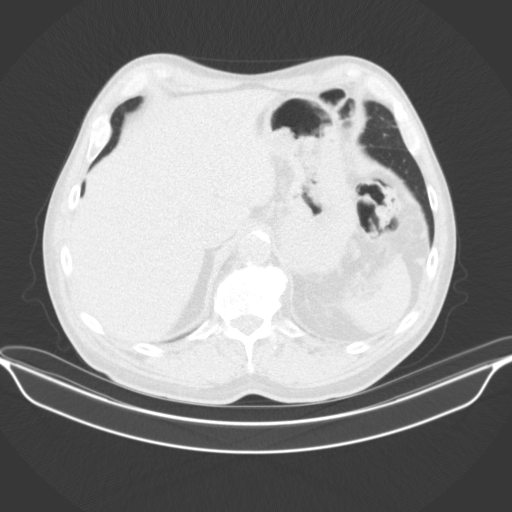
[im 18/79  lung]
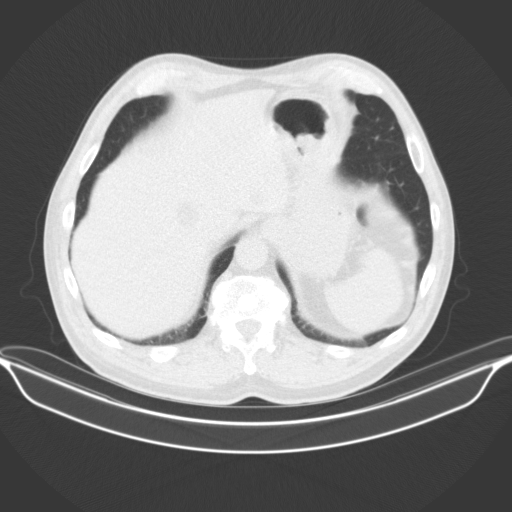
[im 24/79  mediastinal]
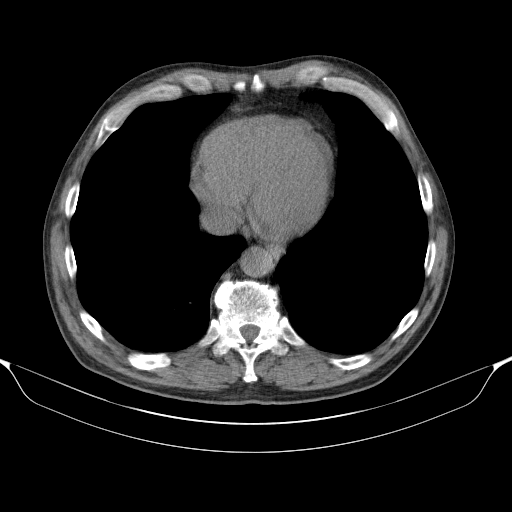
[im 24/79  lung]
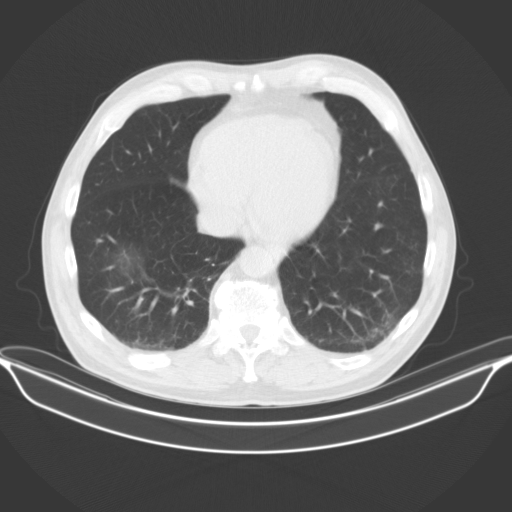
[im 29/79  lung]
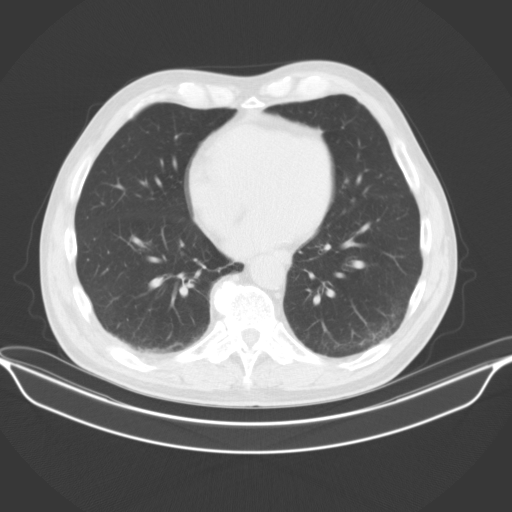
[im 35/79  lung]
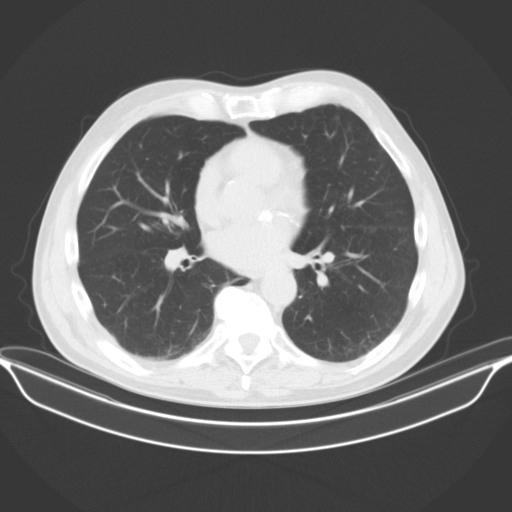
[im 41/79  lung]
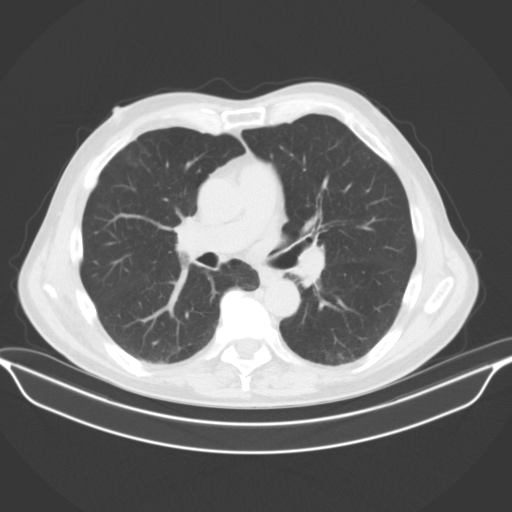
[im 44/79  mediastinal]
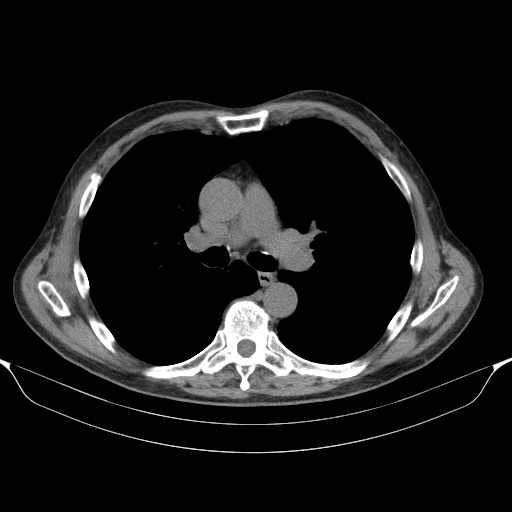
[im 44/79  lung]
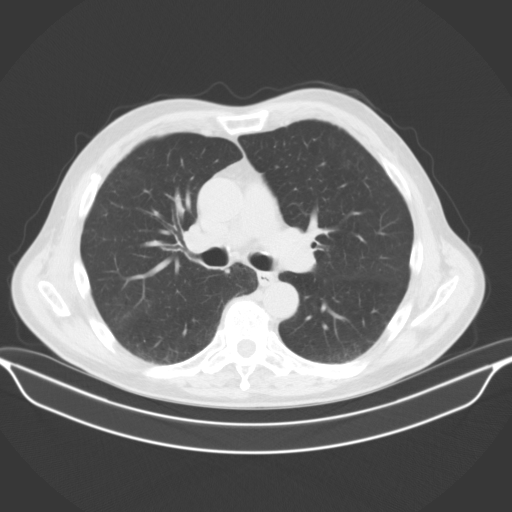
[im 50/79  lung]
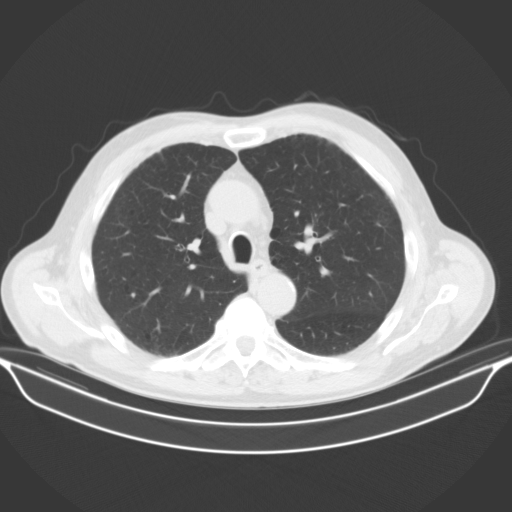
[im 55/79  lung]
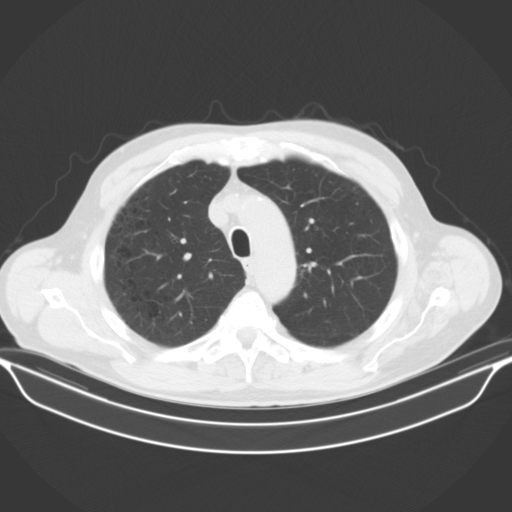
[im 61/79  lung]
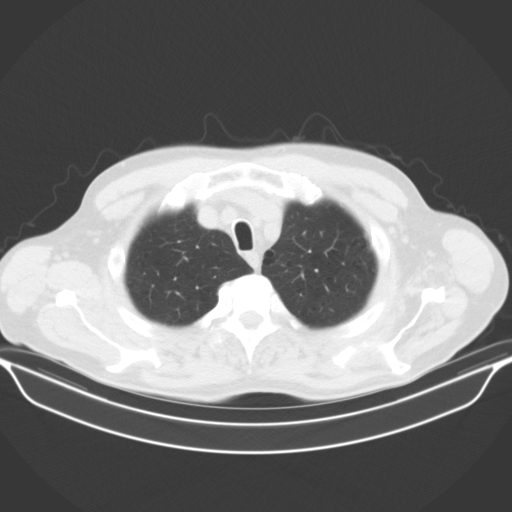
[im 64/79  mediastinal]
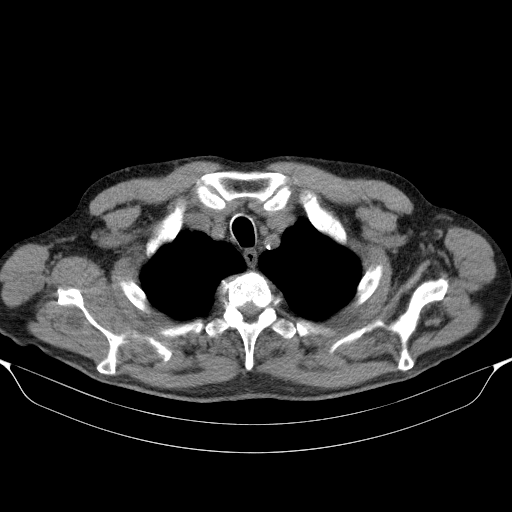
[im 64/79  lung]
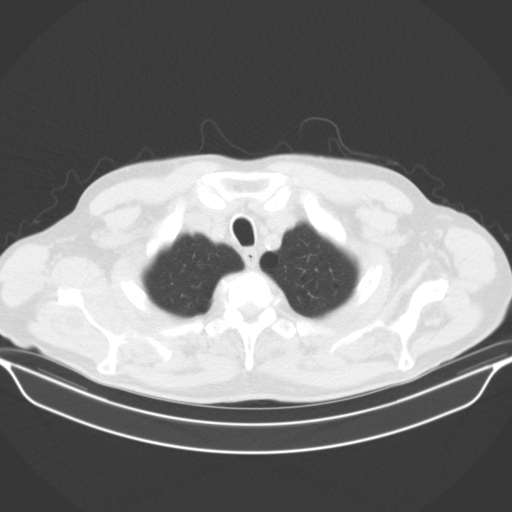
[im 70/79  lung]
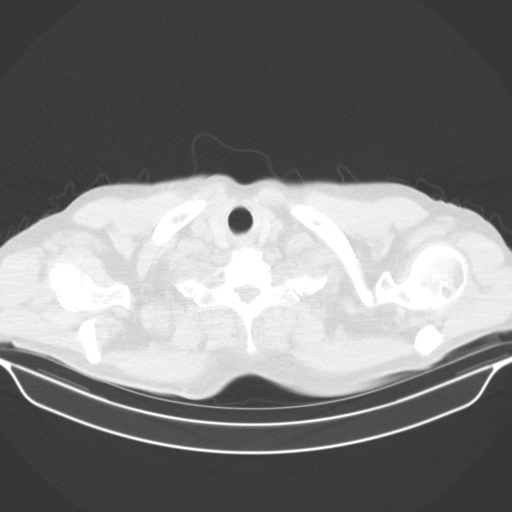
[im 76/79  lung]
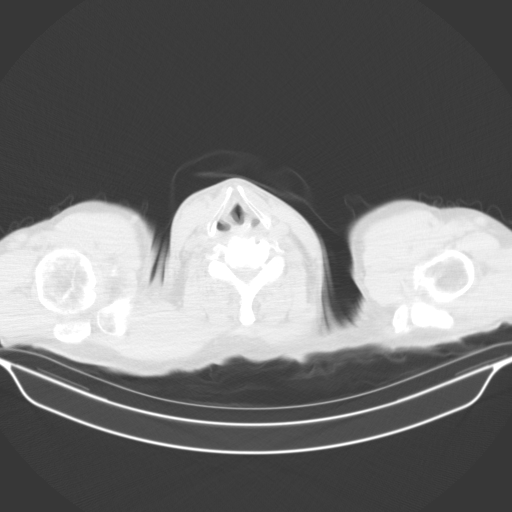

[15 of 31 positions shown; findings below may reference images not displayed]

FINDINGS: Cardiovascular: The heart size is normal. No substantial pericardial
effusion. Coronary artery calcification is evident. Mild
atherosclerotic calcification is noted in the wall of the thoracic
aorta.

Mediastinum/Nodes: No mediastinal lymphadenopathy. No evidence for
gross hilar lymphadenopathy although assessment is limited by the
lack of intravenous contrast on the current study. The esophagus has
normal imaging features. There is no axillary lymphadenopathy.

Lungs/Pleura: Centrilobular and paraseptal emphysema evident. Tiny
bilateral pulmonary nodules identified previously are stable in the
interval. No new suspicious pulmonary nodule or mass. No focal
airspace consolidation. No pleural effusion.

Upper Abdomen: Stable small hepatic cysts.  Otherwise unremarkable.

Musculoskeletal: No worrisome lytic or sclerotic osseous
abnormality.
IMPRESSION: 1. Lung-RADS 2, benign appearance or behavior. Continue annual
screening with low-dose chest CT without contrast in 12 months.
2.  Emphysema (NXVAN-LP4.1) and Aortic Atherosclerosis (NXVAN-170.0)

## 2022-06-23 ENCOUNTER — Telehealth: Payer: Self-pay | Admitting: Internal Medicine

## 2022-06-23 NOTE — Telephone Encounter (Signed)
Inbound call from patient's wife requesting a call back to discuss recent lab results. Please advise, thank you.

## 2022-06-23 NOTE — Telephone Encounter (Signed)
Left message for patient to call back  

## 2022-06-23 NOTE — Telephone Encounter (Signed)
PT returning call. Please advise. 

## 2022-06-26 NOTE — Telephone Encounter (Signed)
Unable to leave message, wife's VM box full.

## 2022-06-26 NOTE — Telephone Encounter (Signed)
Patient's wife called to ask about the results for the H. Pylori diatherix test. Results placed on Dr. Marvell Fuller desk & advised her that we will be in touch once he has returned to the office and reviewed results. Wife verbalized all understanding.

## 2022-06-29 ENCOUNTER — Encounter: Payer: Self-pay | Admitting: Internal Medicine

## 2022-06-30 ENCOUNTER — Other Ambulatory Visit: Payer: Self-pay | Admitting: Internal Medicine

## 2022-06-30 DIAGNOSIS — K769 Liver disease, unspecified: Secondary | ICD-10-CM

## 2022-07-26 ENCOUNTER — Ambulatory Visit
Admission: RE | Admit: 2022-07-26 | Discharge: 2022-07-26 | Disposition: A | Discharge status: Home / Self Care | Payer: BC Managed Care – PPO | Source: Ambulatory Visit | Attending: Internal Medicine | Admitting: Internal Medicine

## 2022-07-26 DIAGNOSIS — K769 Liver disease, unspecified: Secondary | ICD-10-CM

## 2022-11-21 ENCOUNTER — Encounter: Payer: Self-pay | Admitting: Orthopedic Surgery

## 2022-11-21 ENCOUNTER — Other Ambulatory Visit: Payer: Self-pay | Admitting: Orthopedic Surgery

## 2022-11-21 DIAGNOSIS — M545 Low back pain, unspecified: Secondary | ICD-10-CM

## 2022-12-06 ENCOUNTER — Encounter: Payer: Self-pay | Admitting: Orthopedic Surgery

## 2022-12-08 ENCOUNTER — Ambulatory Visit
Admission: RE | Admit: 2022-12-08 | Discharge: 2022-12-08 | Disposition: A | Discharge status: Home / Self Care | Payer: BC Managed Care – PPO | Source: Ambulatory Visit | Attending: Orthopedic Surgery | Admitting: Orthopedic Surgery

## 2022-12-08 DIAGNOSIS — M545 Low back pain, unspecified: Secondary | ICD-10-CM

## 2022-12-12 ENCOUNTER — Encounter: Payer: Self-pay | Admitting: Internal Medicine

## 2023-01-10 ENCOUNTER — Ambulatory Visit (AMBULATORY_SURGERY_CENTER): Payer: BC Managed Care – PPO

## 2023-01-10 VITALS — Ht 70.0 in | Wt 160.0 lb

## 2023-01-10 DIAGNOSIS — Z860100 Personal history of colon polyps, unspecified: Secondary | ICD-10-CM

## 2023-01-10 NOTE — Progress Notes (Signed)
No egg or soy allergy known to patient  No issues known to pt with past sedation with any surgeries or procedures Patient denies ever being told they had issues or difficulty with intubation  No FH of Malignant Hyperthermia Pt is not on diet pills Pt is not on  home 02  Pt is not on blood thinners  Pt states occasional issues with constipation but normally has a bm every day No A fib or A flutter Have any cardiac testing pending--no Patient's chart reviewed by Cathlyn Parsons CNRA prior to previsit and patient appropriate for the [facility].  Previsit completed and red dot placed by patient's name on their procedure day (on provider's schedule).    Pt instructed to use Singlecare.com or GoodRx for a price reduction on prep  Ambulates independently

## 2023-01-25 NOTE — Progress Notes (Signed)
 [facility] History and Physical   Primary Care Physician:  Yolande Toribio MATSU, MD   Reason for Procedure:   Hx colon polyps  Plan:    colonoscopy     HPI: Joseph Mccoy is a 68 y.o. male w/ hx adenomatous colon polyps in 2018 and 2021 - for surveillance exam   Past Medical History:  Diagnosis Date   Allergy to alpha-gal    Anaphylactic reaction    Meat - alpha gal   Gastrointestinal hemorrhage after colonoscopic polypectomy    H. pylori infection    Hx of adenomatous colonic polyps 05/10/2016   Urinary frequency     Past Surgical History:  Procedure Laterality Date   ANKLE FRACTURE SURGERY     COLONOSCOPY  2018   w/bleed and following flex sigmoid   ESOPHAGOGASTRODUODENOSCOPY (EGD) WITH ESOPHAGEAL DILATION  03/23/2022   FLEXIBLE SIGMOIDOSCOPY N/A 05/02/2016   Procedure: FLEXIBLE SIGMOIDOSCOPY;  Surgeon: Toribio SHAUNNA Cedar, MD;  Location: [facility] ENDOSCOPY;  Service: Endoscopy;  Laterality: N/A;   PROSTATE SURGERY     RHINOPLASTY      Prior to Admission medications   Medication Sig Start Date End Date Taking? Authorizing Provider  cyclobenzaprine (FLEXERIL) 5 MG tablet Take 5-10 mg by mouth at bedtime as needed. Patient not taking: Reported on 01/10/2023 02/13/22   [provider]  diazepam  (VALIUM ) 5 MG tablet Take one tablet by mouth with food one hour prior to procedure. May repeat 30 minutes prior if needed. Patient not taking: Reported on 01/10/2023 01/28/21   Williams, Megan E, NP  EPINEPHrine  (EPIPEN  2-PAK) 0.3 mg/0.3 mL IJ SOAJ injection Inject 0.3 mLs (0.3 mg total) into the muscle once. Repeat in 5 minutes if necessary Patient not taking: Reported on 01/10/2023 11/09/13   Baker, Zachary H, PA-C  meloxicam (MOBIC) 15 MG tablet Take 15 mg by mouth daily. 02/13/22   [provider]  omeprazole  (PRILOSEC) 40 MG capsule Take 1 capsule (40 mg total) by mouth daily. Patient not taking: Reported on 01/10/2023 03/31/22   Avram Lupita BRAVO, MD   tamsulosin (FLOMAX) 0.4 MG CAPS capsule Take 0.4 mg by mouth daily. 07/04/19   [provider]  tolterodine (DETROL LA) 2 MG 24 hr capsule Take 2 mg by mouth daily. 05/19/19   [provider]    Current Outpatient Medications  Medication Sig Dispense Refill   tamsulosin (FLOMAX) 0.4 MG CAPS capsule Take 0.4 mg by mouth daily.     tolterodine (DETROL LA) 2 MG 24 hr capsule Take 2 mg by mouth daily.     cyclobenzaprine (FLEXERIL) 5 MG tablet Take 5-10 mg by mouth at bedtime as needed. (Patient not taking: Reported on 01/26/2023)     diazepam  (VALIUM ) 5 MG tablet Take one tablet by mouth with food one hour prior to procedure. May repeat 30 minutes prior if needed. (Patient not taking: Reported on 01/26/2023) 2 tablet 0   EPINEPHrine  (EPIPEN  2-PAK) 0.3 mg/0.3 mL IJ SOAJ injection Inject 0.3 mLs (0.3 mg total) into the muscle once. Repeat in 5 minutes if necessary (Patient not taking: Reported on 05/24/2022) 2 Device 0   meloxicam (MOBIC) 15 MG tablet Take 15 mg by mouth daily.     omeprazole  (PRILOSEC) 40 MG capsule Take 1 capsule (40 mg total) by mouth daily. (Patient not taking: Reported on 01/26/2023) 90 capsule 3   Current Facility-Administered Medications  Medication Dose Route Frequency Provider Last Rate Last Admin   0.9 %  sodium chloride  infusion  500 mL Intravenous  Once Avram Lupita BRAVO, MD        Allergies as of 01/26/2023 - Review Complete 01/26/2023  Allergen Reaction Noted   Galactose Anaphylaxis 12/04/2018   Ham flavoring agent (non-screening) Anaphylaxis 11/09/2013   Other  11/09/2013    Family History  Problem Relation Age of Onset   Colon cancer Neg Hx    Colon polyps Neg Hx    Esophageal cancer Neg Hx    Rectal cancer Neg Hx    Stomach cancer Neg Hx     Social History   Socioeconomic History   Marital status: Married    Spouse name: Not on file   Number of children: Not on file   Years of education: Not on file   Highest education level: Not on file   Occupational History    Employer: [employer]  Tobacco Use   Smoking status: Every Day    Current packs/day: 0.50    Average packs/day: 0.5 packs/day for 20.0 years (10.0 ttl pk-yrs)    Types: Cigarettes    Start date: 2005   Smokeless tobacco: Never   Tobacco comments:    smokes a pack per day as of 05/12/20  Vaping Use   Vaping status: Never Used  Substance and Sexual Activity   Alcohol use: Yes    Alcohol/week: 16.0 standard drinks of alcohol    Types: 12 Cans of beer, 4 Shots of liquor per week   Drug use: No   Sexual activity: Not on file  Other Topics Concern   Not on file  Social History Narrative   Married to [place]   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  Physical Exam: Vital signs BP (!) 145/92   Pulse 66   Temp 97.9 F (36.6 C) (Temporal)   Ht 5' 10 (1.778 m)   Wt 160 lb (72.6 kg)   SpO2 98%   BMI 22.96 kg/m   General:   Alert,  Well-developed, well-nourished, pleasant and cooperative in NAD Lungs:  Clear throughout to auscultation.   Heart:  Regular rate and rhythm; no murmurs, clicks, rubs,  or gallops. Abdomen:  Soft, nontender and nondistended. Normal bowel sounds.   Neuro/Psych:  Alert and cooperative. Normal mood and affect. A and O x 3   @Jlon Betker  CHARLENA Avram, MD, [facility] [phone] (pager) 01/26/2023 11:24 AM@

## 2023-01-26 ENCOUNTER — Ambulatory Visit (AMBULATORY_SURGERY_CENTER): Payer: 59 | Admitting: Internal Medicine

## 2023-01-26 ENCOUNTER — Encounter: Payer: Self-pay | Admitting: Internal Medicine

## 2023-01-26 VITALS — BP 130/67 | HR 68 | Temp 97.9°F | Resp 18 | Ht 70.0 in | Wt 160.0 lb

## 2023-01-26 DIAGNOSIS — Z860101 Personal history of adenomatous and serrated colon polyps: Secondary | ICD-10-CM

## 2023-01-26 DIAGNOSIS — Z1211 Encounter for screening for malignant neoplasm of colon: Secondary | ICD-10-CM | POA: Diagnosis present

## 2023-01-26 DIAGNOSIS — D125 Benign neoplasm of sigmoid colon: Secondary | ICD-10-CM

## 2023-01-26 DIAGNOSIS — Z860100 Personal history of colon polyps, unspecified: Secondary | ICD-10-CM

## 2023-01-26 DIAGNOSIS — K5140 Inflammatory polyps of colon without complications: Secondary | ICD-10-CM | POA: Diagnosis not present

## 2023-01-26 DIAGNOSIS — K5730 Diverticulosis of large intestine without perforation or abscess without bleeding: Secondary | ICD-10-CM | POA: Diagnosis not present

## 2023-01-26 MED ORDER — SODIUM CHLORIDE 0.9 % IV SOLN: 500.0000 mL | Freq: Once | INTRAVENOUS | Status: DC

## 2023-01-26 NOTE — Patient Instructions (Signed)
Resume previous diet Continue present medications Await pathology results Handouts/ information given for polyps, diverticulosis and hemorrhoids  YOU HAD AN ENDOSCOPIC PROCEDURE TODAY AT THE [facility]:   Refer to the procedure report that was given to you for any specific questions about what was found during the examination.  If the procedure report does not answer your questions, please call your gastroenterologist to clarify.  If you requested that your care partner not be given the details of your procedure findings, then the procedure report has been included in a sealed envelope for you to review at your convenience later.  YOU SHOULD EXPECT: Some feelings of bloating in the abdomen. Passage of more gas than usual.  Walking can help get rid of the air that was put into your GI tract during the procedure and reduce the bloating. If you had a lower endoscopy (such as a colonoscopy or flexible sigmoidoscopy) you may notice spotting of blood in your stool or on the toilet paper. If you underwent a bowel prep for your procedure, you may not have a normal bowel movement for a few days.  Please Note:  You might notice some irritation and congestion in your nose or some drainage.  This is from the oxygen used during your procedure.  There is no need for concern and it should clear up in a day or so.  SYMPTOMS TO REPORT IMMEDIATELY:  Following lower endoscopy (colonoscopy or flexible sigmoidoscopy):  Excessive amounts of blood in the stool  Significant tenderness or worsening of abdominal pains  Swelling of the abdomen that is new, acute  Fever of 100F or higher  For urgent or emergent issues, a gastroenterologist can be reached at any hour by calling [phone]. Do not use MyChart messaging for urgent concerns.    DIET:  We do recommend a small meal at first, but then you may proceed to your regular diet.  Drink plenty of fluids but you should avoid alcoholic beverages for 24  hours.  ACTIVITY:  You should plan to take it easy for the rest of today and you should NOT DRIVE or use heavy machinery until tomorrow (because of the sedation medicines used during the test).    FOLLOW UP: Our staff will call the number listed on your records the next business day following your procedure.  We will call around 7:15- 8:00 am to check on you and address any questions or concerns that you may have regarding the information given to you following your procedure. If we do not reach you, we will leave a message.     If any biopsies were taken you will be contacted by phone or by letter within the next 1-3 weeks.  Please call us at [phone] if you have not heard about the biopsies in 3 weeks.    SIGNATURES/CONFIDENTIALITY: You and/or your care partner have signed paperwork which will be entered into your electronic medical record.  These signatures attest to the fact that that the information above on your After Visit Summary has been reviewed and is understood.  Full responsibility of the confidentiality of this discharge information lies with you and/or your care-partner. 

## 2023-01-26 NOTE — Progress Notes (Signed)
 Pt's states no medical or surgical changes since previsit or office visit.

## 2023-01-26 NOTE — Progress Notes (Signed)
 Called to room to assist during endoscopic procedure.  Patient ID and intended procedure confirmed with present staff. Received instructions for my participation in the procedure from the performing physician.

## 2023-01-26 NOTE — Op Note (Signed)
 [facility] Patient Name: Joseph Mccoy Procedure Date: 01/26/2023 10:34 AM MRN: [id number] Endoscopist: Lupita FORBES Commander , MD, [id number] Age: 68 Referring MD:  Date of Birth: 1955/01/31 Gender: Male Account #: [id number] Procedure:                Colonoscopy Indications:              Surveillance: Personal history of adenomatous                            polyps on last colonoscopy > 3 years ago, Last                            colonoscopy: 2021 Medicines:                Monitored Anesthesia Care Procedure:                Pre-Anesthesia Assessment:                           - Prior to the procedure, a History and Physical                            was performed, and patient medications and                            allergies were reviewed. The patient's tolerance of                            previous anesthesia was also reviewed. The risks                            and benefits of the procedure and the sedation                            options and risks were discussed with the patient.                            All questions were answered, and informed consent                            was obtained. Prior Anticoagulants: The patient has                            taken no anticoagulant or antiplatelet agents. ASA                            Grade Assessment: II - A patient with mild systemic                            disease. After reviewing the risks and benefits,                            the patient was deemed in satisfactory condition to  undergo the procedure.                           After obtaining informed consent, the colonoscope                            was passed under direct vision. Throughout the                            procedure, the patient's blood pressure, pulse, and                            oxygen saturations were monitored continuously. The                            CF [id number] #[id number] was introduced through the  anus                            and advanced to the the cecum, identified by                            appendiceal orifice and ileocecal valve. The                            colonoscopy was performed without difficulty. The                            patient tolerated the procedure well. The quality                            of the bowel preparation was good. The ileocecal                            valve, appendiceal orifice, and rectum were                            photographed. Scope In: 11:29:59 AM Scope Out: 11:46:01 AM Scope Withdrawal Time: 0 hours 12 minutes 4 seconds  Total Procedure Duration: 0 hours 16 minutes 2 seconds  Findings:                 The perianal and digital rectal examinations were                            normal.                           A 3 mm polyp was found in the sigmoid colon. The                            polyp was sessile. The polyp was removed with a                            cold snare. Resection and retrieval were complete.  Verification of patient identification for the                            specimen was done. Estimated blood loss was minimal.                           Multiple diverticula were found in the sigmoid                            colon and descending colon.                           The exam was otherwise without abnormality on                            direct and retroflexion views. Complications:            No immediate complications. Estimated Blood Loss:     Estimated blood loss was minimal. Impression:               - One 3 mm polyp in the sigmoid colon, removed with                            a cold snare. Resected and retrieved.                           - Diverticulosis in the sigmoid colon and in the                            descending colon.                           - The examination was otherwise normal on direct                            and retroflexion views.                            - Personal history of colonic polyps. 2018 - 3                            adenomas max 12 mm -                           07/2019 6 adenomas Recommendation:           - Patient has a contact number available for                            emergencies. The signs and symptoms of potential                            delayed complications were discussed with the                            patient. Return to normal activities tomorrow.  Written discharge instructions were provided to the                            patient.                           - Resume previous diet.                           - Continue present medications.                           - Repeat colonoscopy is recommended for                            surveillance. The colonoscopy date will be                            determined after pathology results from today's                            exam become available for review. Lupita FORBES Commander, MD 01/26/2023 11:50:23 AM This report has been signed electronically.

## 2023-01-26 NOTE — Progress Notes (Signed)
 Sedate, gd SR, tolerated procedure well, VSS, report to RN

## 2023-01-29 ENCOUNTER — Telehealth: Payer: Self-pay | Admitting: *Deleted

## 2023-01-29 NOTE — Telephone Encounter (Signed)
  Follow up Call-     01/26/2023   10:29 AM 03/23/2022    9:26 AM  Call back number  Post procedure Call Back phone  # [phone]  Permission to leave phone message Yes Yes     Patient questions:  Do you have a fever, pain , or abdominal swelling? No. Pain Score  0 *  Have you tolerated food without any problems? Yes.    Have you been able to return to your normal activities? Yes.    Do you have any questions about your discharge instructions: Diet   No. Medications  No. Follow up visit  No.  Do you have questions or concerns about your Care? No.  Actions: * If pain score is 4 or above: No action needed, pain <4.

## 2023-01-30 LAB — SURGICAL PATHOLOGY

## 2023-01-31 ENCOUNTER — Encounter: Payer: Self-pay | Admitting: Internal Medicine

## 2023-05-18 ENCOUNTER — Encounter: Payer: Self-pay | Admitting: Acute Care

## 2023-05-21 ENCOUNTER — Ambulatory Visit
Admission: RE | Admit: 2023-05-21 | Discharge: 2023-05-21 | Disposition: A | Discharge status: Home / Self Care | Payer: BC Managed Care – PPO | Source: Ambulatory Visit | Attending: Acute Care | Admitting: Acute Care

## 2023-05-21 DIAGNOSIS — F17210 Nicotine dependence, cigarettes, uncomplicated: Secondary | ICD-10-CM

## 2023-05-21 DIAGNOSIS — Z87891 Personal history of nicotine dependence: Secondary | ICD-10-CM

## 2023-06-13 ENCOUNTER — Other Ambulatory Visit: Payer: Self-pay | Admitting: Acute Care

## 2023-06-13 DIAGNOSIS — Z122 Encounter for screening for malignant neoplasm of respiratory organs: Secondary | ICD-10-CM

## 2023-06-13 DIAGNOSIS — F17210 Nicotine dependence, cigarettes, uncomplicated: Secondary | ICD-10-CM

## 2023-06-13 DIAGNOSIS — Z87891 Personal history of nicotine dependence: Secondary | ICD-10-CM

## 2023-08-02 ENCOUNTER — Other Ambulatory Visit (HOSPITAL_COMMUNITY): Payer: Self-pay | Admitting: Internal Medicine

## 2023-08-02 ENCOUNTER — Ambulatory Visit: Admitting: Internal Medicine

## 2023-08-02 ENCOUNTER — Encounter: Payer: Self-pay | Admitting: Internal Medicine

## 2023-08-02 VITALS — BP 100/68 | HR 72 | Ht 68.0 in | Wt 172.1 lb

## 2023-08-02 DIAGNOSIS — K760 Fatty (change of) liver, not elsewhere classified: Secondary | ICD-10-CM | POA: Diagnosis not present

## 2023-08-02 DIAGNOSIS — K219 Gastro-esophageal reflux disease without esophagitis: Secondary | ICD-10-CM | POA: Diagnosis not present

## 2023-08-02 DIAGNOSIS — K222 Esophageal obstruction: Secondary | ICD-10-CM | POA: Diagnosis not present

## 2023-08-02 DIAGNOSIS — R059 Cough, unspecified: Secondary | ICD-10-CM

## 2023-08-02 DIAGNOSIS — R1310 Dysphagia, unspecified: Secondary | ICD-10-CM

## 2023-08-02 MED ORDER — OMEPRAZOLE 40 MG PO CPDR: 40.0000 mg | DELAYED_RELEASE_CAPSULE | Freq: Every day | ORAL | 3 refills | Status: AC

## 2023-08-02 NOTE — Patient Instructions (Signed)
 You have been scheduled for a modified barium swallow on 08/21/2023 at 1:00pm. Please arrive 15  minutes prior to your test for registration. You will go to [facility] (1st Floor) for your appointment. Should you need to cancel or reschedule your appointment, please contact [phone] [place]) or [phone] Geroge Long). _____________________________________________________________________ A Modified Barium Swallow Study, or MBS, is a special x-ray that is taken to check swallowing skills. It is carried out by a Marine scientist and a Warehouse manager (SLP). During this test, yourmouth, throat, and esophagus, a muscular tube which connects your mouth to your stomach, is checked. The test will help you, your doctor, and the SLP plan what types of foods and liquids are easier for you to swallow. The SLP will also identify positions and ways to help you swallow more easily and safely. What will happen during an MBS? You will be taken to an x-ray room and seated comfortably. You will be asked to swallow small amounts of food and liquid mixed with barium. Barium is a liquid or paste that allows images of your mouth, throat and esophagus to be seen on x-ray. The x-ray captures moving images of the food you are swallowing as it travels from your mouth through your throat and into your esophagus. This test helps identify whether food or liquid is entering your lungs (aspiration). The test also shows which part of your mouth or throat lacks strength or coordination to move the food or liquid in the right direction. This test typically takes 30 minutes to 1 hour to complete. _______________________________________________________________________  It is in your best interest to eliminate all alcohol.  I appreciate the opportunity to care for you. Lupita Commander, MD, [facility]

## 2023-08-02 NOTE — Progress Notes (Signed)
 Joseph Mccoy 68 y.o. Dec 13, 1955 [id number]  Assessment & Plan:   Encounter Diagnoses  Name Primary?   Cough, unspecified type Yes   GERD with stricture    Gastroesophageal reflux disease, unspecified whether esophagitis present    Hepatic steatosis    Evaluate this cough and questionable aspiration with a modified barium swallow  Resume omeprazole  40 mg daily  It is in his best or interest and I have recommended that he eliminate alcohol given hepatic steatosis.    Further plans pending modified barium swallow.  CC: Yolande Toribio MATSU, MD    Subjective:   Chief Complaint: Choking when swallowing liquids  HPI The patient is here, he has a history of H. pylori gastritis, GERD with stricture and dilation for dysphagia in February 2024, and alpha gal who is here with complaints of choking.  He is having problems where saliva and liquids appeared to go into the airway and he coughs quite a bit.  Crumbly foods like corn bread or other things such as nuts will sometimes make him cough as well.  He is not having dysphagia to food or liquids otherwise.  He does not think he is taking omeprazole  as prescribed previously.  He is not describing significant heartburn issues.  Screening CT of the chest showed hepatic steatosis.  He drinks 3-4 beers daily.  February 2025 labs at primary care, MCV 111.5 hemoglobin 15.6 white count 9.1 platelets 214 AST 35 ALT 37 alk phos 61 albumin 4.8 total bilirubin 0.9 Wt Readings from Last 3 Encounters:  08/02/23 172 lb 2 oz (78.1 kg)  01/26/23 160 lb (72.6 kg)  01/10/23 160 lb (72.6 kg)   EGD 03/23/2022   - Benign-appearing esophageal stenosis. Dilated.                            Associated slight inflammation/esophagitis                           - 3 cm hiatal hernia.                           - Gastritis. Biopsied.-H. pylori found and treated and eradication confirmed                           - Duodenitis.                           - The  examination was otherwise normal Allergies  Allergen Reactions   Galactose Anaphylaxis   Ham Flavoring Agent (Non-Screening) Anaphylaxis    No ham, no beef, no pork   Pork Allergy Anaphylaxis    No ham, no beef, no pork   Alpha-Gal    Other     Mammalian meat and other environmental allergies   Current Meds  Medication Sig   cyclobenzaprine (FLEXERIL) 5 MG tablet Take 5-10 mg by mouth at bedtime as needed.   diazepam  (VALIUM ) 5 MG tablet Take one tablet by mouth with food one hour prior to procedure. May repeat 30 minutes prior if needed.   EPINEPHrine  (EPIPEN  2-PAK) 0.3 mg/0.3 mL IJ SOAJ injection Inject 0.3 mLs (0.3 mg total) into the muscle once. Repeat in 5 minutes if necessary   meloxicam (MOBIC) 15 MG tablet Take 15 mg by mouth daily.  sildenafil (VIAGRA) 100 MG tablet Take 100 mg by mouth as needed.   tamsulosin (FLOMAX) 0.4 MG CAPS capsule Take 0.4 mg by mouth daily.   tolterodine (DETROL LA) 2 MG 24 hr capsule Take 2 mg by mouth daily.   Past Medical History:  Diagnosis Date   Allergy to alpha-gal    Anaphylactic reaction    Meat - alpha gal   Gastrointestinal hemorrhage after colonoscopic polypectomy    H. 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 also   He is a Surveyor, minerals also   Cigarette smoker 3-4 beers/day and no drug use no other tobacco    family history is not on file.   Review of Systems As per HPI  Objective:   Physical Exam @BP  100/68 (BP Location: Left Arm, Patient Position: Sitting, Cuff Size: Normal)   Pulse 72   Ht 5' 8 (1.727 m) Comment: height measured without shoes  Wt 172 lb 2 oz (78.1 kg)   BMI 26.17 kg/m @  General:  NAD Mouth and posterior pharynx clear and no cervical LA Eyes:   anicteric Lungs:  clear Heart::  S1S2 no rubs, murmurs or gallops Abdomen:  soft and nontender, BS+ Ext:   no edema, cyanosis or clubbing    Data Reviewed:  See HPI

## 2023-08-21 ENCOUNTER — Ambulatory Visit (HOSPITAL_COMMUNITY)
Admission: RE | Admit: 2023-08-21 | Discharge: 2023-08-21 | Disposition: A | Discharge status: Home / Self Care | Source: Ambulatory Visit | Attending: Internal Medicine | Admitting: Internal Medicine

## 2023-08-21 ENCOUNTER — Encounter (HOSPITAL_COMMUNITY): Payer: Self-pay

## 2023-08-21 ENCOUNTER — Telehealth (HOSPITAL_COMMUNITY): Payer: Self-pay | Admitting: Internal Medicine

## 2023-08-21 ENCOUNTER — Ambulatory Visit (HOSPITAL_COMMUNITY): Admission: RE | Admit: 2023-08-21 | Source: Ambulatory Visit

## 2023-08-21 DIAGNOSIS — R059 Cough, unspecified: Secondary | ICD-10-CM

## 2023-08-21 NOTE — Telephone Encounter (Signed)
 Attempted to contact pt to check-in about missing scheduled 7/29 OP MBS (swallow test), VM box is full and could not leave msg.  (AHARRIS)

## 2023-09-13 ENCOUNTER — Encounter (HOSPITAL_COMMUNITY)

## 2023-09-19 ENCOUNTER — Ambulatory Visit (HOSPITAL_COMMUNITY): Admission: RE | Admit: 2023-09-19 | Source: Ambulatory Visit

## 2023-09-19 ENCOUNTER — Encounter (HOSPITAL_COMMUNITY): Payer: Self-pay

## 2023-09-26 DIAGNOSIS — A048 Other specified bacterial intestinal infections: Secondary | ICD-10-CM | POA: Insufficient documentation

## 2023-09-26 DIAGNOSIS — Z7689 Persons encountering health services in other specified circumstances: Secondary | ICD-10-CM | POA: Insufficient documentation

## 2023-09-26 DIAGNOSIS — R350 Frequency of micturition: Secondary | ICD-10-CM | POA: Insufficient documentation

## 2023-09-26 DIAGNOSIS — T782XXA Anaphylactic shock, unspecified, initial encounter: Secondary | ICD-10-CM | POA: Insufficient documentation

## 2023-09-27 ENCOUNTER — Encounter: Payer: Self-pay | Admitting: Cardiology

## 2023-09-27 ENCOUNTER — Ambulatory Visit: Attending: Cardiology | Admitting: Cardiology

## 2023-09-27 VITALS — BP 106/68 | HR 74 | Ht 70.0 in | Wt 171.0 lb

## 2023-09-27 DIAGNOSIS — E782 Mixed hyperlipidemia: Secondary | ICD-10-CM | POA: Diagnosis not present

## 2023-09-27 DIAGNOSIS — F17210 Nicotine dependence, cigarettes, uncomplicated: Secondary | ICD-10-CM | POA: Insufficient documentation

## 2023-09-27 DIAGNOSIS — I2510 Atherosclerotic heart disease of native coronary artery without angina pectoris: Secondary | ICD-10-CM | POA: Diagnosis not present

## 2023-09-27 DIAGNOSIS — I700 Atherosclerosis of aorta: Secondary | ICD-10-CM | POA: Diagnosis not present

## 2023-09-27 DIAGNOSIS — Z7689 Persons encountering health services in other specified circumstances: Secondary | ICD-10-CM

## 2023-09-27 MED ORDER — ROSUVASTATIN CALCIUM 10 MG PO TABS
10.0000 mg | ORAL_TABLET | Freq: Every day | ORAL | 3 refills | Status: AC
Start: 2023-09-27 — End: ?

## 2023-09-27 NOTE — Progress Notes (Signed)
 Cardiology Office Note:    Date:  09/27/2023   ID:  Joseph Mccoy, DOB Dec 21, 1955, MRN [id number]  PCP:  Yolande Toribio MATSU, MD  Cardiologist:  Jennifer JONELLE Crape, MD   Referring MD: Yolande Toribio MATSU, MD    ASSESSMENT:    1. Encounter to establish care   2. Coronary artery calcification   3. Aortic atherosclerosis (HCC)   4. Mixed dyslipidemia   5. Cigarette smoker    PLAN:    In order of problems listed above:  Coronary artery calcification: Dyspnea on exertion:Primary prevention stressed with the patient.  Importance of compliance with diet medication stressed and patient verbalized standing.  I discussed my findings with the patient in extensive length and discussed exercise stress Cardiolite and he is agreeable. Cardiac murmur: Echocardiogram will be done to assess murmur heard on auscultation. Prominent abdominal pulsations: Will do ultrasound to rule out abdominal aortic aneurysm.  He has history of long-term smoking: Cigarette smoker: I spent 5 minutes with the patient discussing solely about smoking. Smoking cessation was counseled. I suggested to the patient also different medications and pharmacological interventions. Patient is keen to try stopping on its own at this time. He will get back to me if he needs any further assistance in this matter. Mixed dyslipidemia: Will initiate Crestor  10 mg daily.  Will do baseline blood work today.  He is agreeable.  Benefits risks explained. Patient will be seen in follow-up appointment in 6 months or earlier if the patient has any concerns.    Medication Adjustments/Labs and Tests Ordered: Current medicines are reviewed at length with the patient today.  Concerns regarding medicines are outlined above.  Orders Placed This Encounter  Procedures   EKG 12-Lead   No orders of the defined types were placed in this encounter.    History of Present Illness:    Joseph Mccoy is a 68 y.o. male who is being seen today for the evaluation  of dyspnea on exertion and coronary artery calcification at the request of Yolande Toribio MATSU, MD. patient is a pleasant 68 year old male.  He has past medical history of recently diagnosed aortic and coronary artery calcification, mixed dyslipidemia and cigarette smoking.  Patient has longstanding history of smoking.  He denies any chest pain orthopnea or PND.  He walks some on a regular basis.  He does not exercise.  He has some dyspnea on exertion.  No history of dizziness or syncope.  At the time of my evaluation, the patient is alert awake oriented and in no distress.  Past Medical History:  Diagnosis Date   Allergy to alpha-gal    Anaphylactic reaction    Meat - alpha gal   Encounter to establish care    H. pylori infection    Helicobacter pylori gastritis 03/31/2022   1) Pantoprazole 40 mg qd - GERD  2) Pepto Bismol 2 tabs (262 mg each) 4 times a day x 14 d  3) Metronidazole  250 mg 4 times a day x 14 d  4) doxycycline  100 mg 2 times a day x 14 d     Will f/u in office and can do Diatherex test while on PPI        Hx of adenomatous colonic polyps 05/10/2016   Urinary frequency     Past Surgical History:  Procedure Laterality Date   ANKLE FRACTURE SURGERY     COLONOSCOPY  2018   w/bleed and following flex sigmoid   ESOPHAGOGASTRODUODENOSCOPY (EGD) WITH ESOPHAGEAL DILATION  03/23/2022  FLEXIBLE SIGMOIDOSCOPY N/A 05/02/2016   Procedure: FLEXIBLE SIGMOIDOSCOPY;  Surgeon: Toribio SHAUNNA Cedar, MD;  Location: [facility] ENDOSCOPY;  Service: Endoscopy;  Laterality: N/A;   PROSTATE SURGERY     RHINOPLASTY      Current Medications: Current Meds  Medication Sig   cyclobenzaprine (FLEXERIL) 5 MG tablet Take 5-10 mg by mouth at bedtime as needed.   diazepam  (VALIUM ) 5 MG tablet Take one tablet by mouth with food one hour prior to procedure. May repeat 30 minutes prior if needed.   EPINEPHrine  (EPIPEN  2-PAK) 0.3 mg/0.3 mL IJ SOAJ injection Inject 0.3 mLs (0.3 mg total) into the muscle once. Repeat in 5  minutes if necessary   meloxicam (MOBIC) 15 MG tablet Take 15 mg by mouth daily.   omeprazole  (PRILOSEC) 40 MG capsule Take 1 capsule (40 mg total) by mouth daily.   pantoprazole (PROTONIX) 40 MG tablet Take 40 mg by mouth as needed (heartburn).   tamsulosin (FLOMAX) 0.4 MG CAPS capsule Take 0.4 mg by mouth daily.   tolterodine (DETROL LA) 2 MG 24 hr capsule Take 2 mg by mouth daily.     Allergies:   Galactose, Ham flavoring agent (non-screening), Pork allergy, Alpha-gal, and Other   Social History   Socioeconomic History   Marital status: Married    Spouse name: Not on file   Number of children: Not on file   Years of education: Not on file   Highest education level: Not on file  Occupational History    Employer: [employer]  Tobacco Use   Smoking status: Every Day    Current packs/day: 0.50    Average packs/day: 0.5 packs/day for 20.7 years (10.3 ttl pk-yrs)    Types: Cigarettes    Start date: 2005   Smokeless tobacco: Never   Tobacco comments:    smokes a pack per day as of 05/12/20  Vaping Use   Vaping status: Never Used  Substance and Sexual Activity   Alcohol use: Yes    Alcohol/week: 16.0 standard drinks of alcohol    Types: 12 Cans of beer, 4 Shots of liquor per week   Drug use: No   Sexual activity: Not on file  Other Topics Concern   Not on file  Social History Narrative   Married to 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  ROS:   Please see the history of present illness.    All other systems reviewed and are negative.  EKGs/Labs/Other Studies Reviewed:    The following studies were reviewed today:  EKG Interpretation Date/Time:  Thursday September 27 2023 13:44:05 EDT Ventricular Rate:  74 PR Interval:  154 QRS Duration:  156 QT Interval:  418 QTC Calculation: 463 R Axis:   146  Text Interpretation: Sinus rhythm with Premature atrial complexes Right bundle branch block When compared with ECG of 05-Oct-2020 10:04, Premature atrial complexes are now Present Right bundle branch block is now Present  Confirmed by Edwyna Backers [phone]) on 09/27/2023 2:57:02 PM     Recent Labs: No results found for requested labs within last 365 days.  Recent Lipid Panel No results found for: CHOL, TRIG, HDL, CHOLHDL, VLDL, LDLCALC, LDLDIRECT  Physical Exam:    VS:  BP 106/68   Pulse 74   Ht 5' 10 (1.778 m)   Wt 171 lb (77.6 kg)   SpO2 96%   BMI 24.54 kg/m     Wt Readings from Last 3 Encounters:  09/27/23 171 lb (77.6 kg)  08/02/23 172 lb 2 oz (78.1 kg)  01/26/23 160 lb (72.6 kg)     GEN: Patient is in no acute distress HEENT: Normal NECK: No JVD; No carotid bruits LYMPHATICS: No lymphadenopathy CARDIAC: S1 S2 regular, 2/6 systolic murmur at the apex. RESPIRATORY:  Clear to auscultation without rales, wheezing or rhonchi  ABDOMEN: Soft, non-tender, non-distended MUSCULOSKELETAL:  No edema; No deformity  SKIN: Warm and dry NEUROLOGIC:  Alert and oriented x 3 PSYCHIATRIC:  Normal affect    Signed, Backers JONELLE Edwyna, MD  09/27/2023 3:13 PM     [facility]

## 2023-09-27 NOTE — Patient Instructions (Signed)
 Medication Instructions:  Your physician has recommended you make the following change in your medication:   START: Crestor  10 mg daily  *If you need a refill on your cardiac medications before your next appointment, please call your pharmacy*  Lab Work: Your physician recommends that you return for lab work in:   Labs today: CMP Labs in 6 weeks: CMP, Lipids  If you have labs (blood work) drawn today and your tests are completely normal, you will receive your results only by: MyChart Message (if you have MyChart) OR A paper copy in the mail If you have any lab test that is abnormal or we need to change your treatment, we will call you to review the results.  Testing/Procedures: Your physician has requested that you have an echocardiogram. Echocardiography is a painless test that uses sound waves to create images of your heart. It provides your doctor with information about the size and shape of your heart and how well your heart's chambers and valves are working. This procedure takes approximately one hour. There are no restrictions for this procedure. Please do NOT wear cologne, perfume, aftershave, or lotions (deodorant is allowed). Please arrive 15 minutes prior to your appointment time.  Please note: We ask at that you not bring children with you during ultrasound (echo/ vascular) testing. Due to room size and safety concerns, children are not allowed in the ultrasound rooms during exams. Our front office staff cannot provide observation of children in our lobby area while testing is being conducted. An adult accompanying a patient to their appointment will only be allowed in the ultrasound room at the discretion of the ultrasound technician under special circumstances. We apologize for any inconvenience.  Your physician has requested that you have an abdominal aorta duplex. During this test, an ultrasound is used to evaluate the aorta. Allow 30 minutes for this exam. Do not eat after  midnight the day before and avoid carbonated beverages.  Please note: We ask at that you not bring children with you during ultrasound (echo/ vascular) testing. Due to room size and safety concerns, children are not allowed in the ultrasound rooms during exams. Our front office staff cannot provide observation of children in our lobby area while testing is being conducted. An adult accompanying a patient to their appointment will only be allowed in the ultrasound room at the discretion of the ultrasound technician under special circumstances. We apologize for any inconvenience.    [facility] [address] [place], [place] [address] Phone: [phone]    Please arrive 15 minutes prior to your appointment time for registration and insurance purposes.  The test will take approximately 3 to 4 hours to complete; you may bring reading material.  If someone comes with you to your appointment, they will need to remain in the main lobby due to limited space in the testing area. **If you are pregnant or breastfeeding, please notify the nuclear lab prior to your appointment**  How to prepare for your Myocardial Perfusion Test: Do not eat or drink 3 hours prior to your test, except you may have water. Do not consume products containing caffeine (regular or decaffeinated) 12 hours prior to your test. (ex: coffee, chocolate, sodas, tea). Do bring a list of your current medications with you.  If not listed below, you may take your medications as normal. Do wear comfortable clothes (no dresses or overalls) and walking shoes, tennis shoes preferred (No heels or open toe shoes are allowed). Do  NOT wear cologne, perfume, aftershave, or lotions (deodorant is allowed). If these instructions are not followed, your test will have to be rescheduled.  Please report to [address] ([facility] 2nd Floor, for your test.   If you have questions or concerns about your appointment, you can call the Nuclear Lab at [phone].  If you cannot keep your appointment, please provide 24 hours notification to the Nuclear Lab, to avoid a possible $50 charge to your account.   Follow-Up: At [facility], you and your health needs are our priority.  As part of our continuing mission to provide you with exceptional heart care, our providers are all part of one team.  This team includes your primary Cardiologist (physician) and Advanced Practice Providers or APPs (Physician Assistants and Nurse Practitioners) who all work together to provide you with the care you need, when you need it.  Your next appointment:   1 year(s)  Provider:   Jennifer Crape, MD    We recommend signing up for the patient portal called MyChart.  Sign up information is provided on this After Visit Summary.  MyChart is used to connect with patients for Virtual Visits (Telemedicine).  Patients are able to view lab/test results, encounter notes, upcoming appointments, etc.  Non-urgent messages can be sent to your provider as well.   To learn more about what you can do with MyChart, go to [email or web address].   Other Instructions None

## 2023-09-28 LAB — COMPREHENSIVE METABOLIC PANEL WITH GFR
ALT: 55 IU/L — ABNORMAL HIGH (ref 0–44)
AST: 53 IU/L — ABNORMAL HIGH (ref 0–40)
Albumin: 4.9 g/dL (ref 3.9–4.9)
Alkaline Phosphatase: 73 IU/L (ref 44–121)
BUN/Creatinine Ratio: 6 — ABNORMAL LOW (ref 10–24)
BUN: 5 mg/dL — ABNORMAL LOW (ref 8–27)
Bilirubin Total: 0.3 mg/dL (ref 0.0–1.2)
CO2: 23 mmol/L (ref 20–29)
Calcium: 9.7 mg/dL (ref 8.6–10.2)
Chloride: 102 mmol/L (ref 96–106)
Creatinine, Ser: 0.8 mg/dL (ref 0.76–1.27)
Globulin, Total: 2.3 g/dL (ref 1.5–4.5)
Glucose: 81 mg/dL (ref 70–99)
Potassium: 4.7 mmol/L (ref 3.5–5.2)
Sodium: 141 mmol/L (ref 134–144)
Total Protein: 7.2 g/dL (ref 6.0–8.5)
eGFR: 96 mL/min/1.73 (ref >59)

## 2023-09-29 ENCOUNTER — Ambulatory Visit: Payer: Self-pay | Admitting: Cardiology

## 2023-09-29 DIAGNOSIS — R7989 Other specified abnormal findings of blood chemistry: Secondary | ICD-10-CM

## 2023-10-01 NOTE — Telephone Encounter (Signed)
-----   Message from Jennifer SAUNDERS Revankar sent at 09/29/2023  5:11 PM EDT ----- Do not take statin. Stop id you are taking. Talk to pcp about elevated lft and then do as they say. Cc pcp Refer to lipid clinic Jennifer SAUNDERS Crape, MD 09/29/2023 5:10 PM  ----- Message ----- From: Rebecka Memos Lab Results In Sent: 09/28/2023   9:36 AM EDT To: Jennifer SAUNDERS Crape, MD

## 2023-10-01 NOTE — Telephone Encounter (Signed)
 MyChart message

## 2023-10-22 ENCOUNTER — Encounter: Payer: Self-pay | Admitting: Behavioral Health

## 2023-10-22 ENCOUNTER — Ambulatory Visit (INDEPENDENT_AMBULATORY_CARE_PROVIDER_SITE_OTHER): Admitting: Behavioral Health

## 2023-10-22 DIAGNOSIS — Z0389 Encounter for observation for other suspected diseases and conditions ruled out: Secondary | ICD-10-CM

## 2023-10-22 NOTE — Progress Notes (Signed)
 Pt  says that he currently does not have any problems he is concerned with including depression. Says that his wife wanted him to come to appointment. Says that he reports depression at 0/10 and anxiety 0/10. He does not need medication management and does not want to consider any medication at this time. He will notify PCP if he decides he needs any assistance on reducing ETOH intake or support networks.  I will not be taking this patient for management at this time. There will be no charge for today's visit.

## 2023-10-24 ENCOUNTER — Encounter: Payer: Self-pay | Admitting: *Deleted

## 2023-10-26 ENCOUNTER — Telehealth (HOSPITAL_COMMUNITY): Payer: Self-pay | Admitting: Cardiology

## 2023-10-26 NOTE — Telephone Encounter (Signed)
 Patient called and cancelled Myoview and testing for reason below:  10/26/2023 11:19 AM Ab:TNNID, BELISICIA T  Cancel Rsn: Patient (Having personal issues, will c/b to reschedule)   Order will be removed from the active NUC wq and if patient calls back we will reinstate the order. Thank you.

## 2023-10-26 NOTE — Telephone Encounter (Signed)
 Patient cancelled for reason below:

## 2023-10-31 ENCOUNTER — Ambulatory Visit

## 2023-10-31 ENCOUNTER — Other Ambulatory Visit

## 2024-01-14 ENCOUNTER — Other Ambulatory Visit: Payer: Self-pay | Admitting: Internal Medicine

## 2024-01-14 DIAGNOSIS — R1319 Other dysphagia: Secondary | ICD-10-CM

## 2024-03-06 ENCOUNTER — Other Ambulatory Visit

## 2024-05-21 ENCOUNTER — Ambulatory Visit (HOSPITAL_BASED_OUTPATIENT_CLINIC_OR_DEPARTMENT_OTHER)
